# Patient Record
Sex: Female | Born: 1975 | Race: Black or African American | Hispanic: No | Marital: Single | Smoking: Never smoker
Health system: Southern US, Community
[De-identification: ages and names within clinical notes are randomized; demographics above are authoritative.]

## PROBLEM LIST (undated history)

## (undated) DIAGNOSIS — F329 Major depressive disorder, single episode, unspecified: Secondary | ICD-10-CM

## (undated) DIAGNOSIS — G4733 Obstructive sleep apnea (adult) (pediatric): Secondary | ICD-10-CM

## (undated) DIAGNOSIS — E785 Hyperlipidemia, unspecified: Secondary | ICD-10-CM

## (undated) DIAGNOSIS — I639 Cerebral infarction, unspecified: Secondary | ICD-10-CM

## (undated) DIAGNOSIS — F32A Depression, unspecified: Secondary | ICD-10-CM

## (undated) DIAGNOSIS — G43909 Migraine, unspecified, not intractable, without status migrainosus: Secondary | ICD-10-CM

## (undated) DIAGNOSIS — E559 Vitamin D deficiency, unspecified: Secondary | ICD-10-CM

## (undated) DIAGNOSIS — I10 Essential (primary) hypertension: Secondary | ICD-10-CM

## (undated) DIAGNOSIS — F419 Anxiety disorder, unspecified: Secondary | ICD-10-CM

## (undated) HISTORY — DX: Vitamin D deficiency, unspecified: E55.9

## (undated) HISTORY — DX: Obstructive sleep apnea (adult) (pediatric): G47.33

## (undated) HISTORY — DX: Hyperlipidemia, unspecified: E78.5

## (undated) HISTORY — DX: Essential (primary) hypertension: I10

## (undated) HISTORY — DX: Depression, unspecified: F32.A

## (undated) HISTORY — DX: Cerebral infarction, unspecified: I63.9

## (undated) HISTORY — DX: Morbid (severe) obesity due to excess calories: E66.01

## (undated) HISTORY — DX: Major depressive disorder, single episode, unspecified: F32.9

## (undated) HISTORY — DX: Anxiety disorder, unspecified: F41.9

## (undated) HISTORY — DX: Migraine, unspecified, not intractable, without status migrainosus: G43.909

## (undated) HISTORY — PX: LAPAROSCOPIC GASTRIC SLEEVE RESECTION: SHX5895

---

## 2003-06-06 LAB — HM MAMMOGRAPHY: HM Mammogram: 0

## 2005-01-12 ENCOUNTER — Ambulatory Visit: Payer: Self-pay | Admitting: Internal Medicine

## 2008-06-05 LAB — HM DIABETES FOOT EXAM: HM Diabetic Foot Exam: 0

## 2008-09-23 ENCOUNTER — Other Ambulatory Visit: Payer: Self-pay | Admitting: Emergency Medicine

## 2008-09-24 ENCOUNTER — Emergency Department (HOSPITAL_COMMUNITY): Admission: EM | Admit: 2008-09-24 | Discharge: 2008-09-24 | Payer: Self-pay | Admitting: Emergency Medicine

## 2010-02-06 ENCOUNTER — Emergency Department (HOSPITAL_BASED_OUTPATIENT_CLINIC_OR_DEPARTMENT_OTHER): Admission: EM | Admit: 2010-02-06 | Discharge: 2010-02-06 | Payer: Self-pay | Admitting: Emergency Medicine

## 2010-05-10 ENCOUNTER — Emergency Department (HOSPITAL_BASED_OUTPATIENT_CLINIC_OR_DEPARTMENT_OTHER)
Admission: EM | Admit: 2010-05-10 | Discharge: 2010-05-10 | Payer: Self-pay | Source: Home / Self Care | Admitting: Emergency Medicine

## 2010-08-18 LAB — URINE MICROSCOPIC-ADD ON

## 2010-08-18 LAB — URINALYSIS, ROUTINE W REFLEX MICROSCOPIC
Glucose, UA: NEGATIVE mg/dL
Leukocytes, UA: NEGATIVE
pH: 5 (ref 5.0–8.0)

## 2010-08-18 LAB — PREGNANCY, URINE: Preg Test, Ur: NEGATIVE

## 2010-09-14 LAB — CBC
MCHC: 31.6 g/dL (ref 30.0–36.0)
MCV: 81.5 fL (ref 78.0–100.0)
WBC: 10 10*3/uL (ref 4.0–10.5)

## 2010-09-14 LAB — DIFFERENTIAL
Lymphocytes Relative: 33 % (ref 12–46)
Monocytes Relative: 8 % (ref 3–12)
Neutro Abs: 5.8 10*3/uL (ref 1.7–7.7)

## 2010-09-14 LAB — BASIC METABOLIC PANEL
CO2: 31 mEq/L (ref 19–32)
Creatinine, Ser: 0.7 mg/dL (ref 0.4–1.2)
GFR calc Af Amer: >60 mL/min (ref >60)
GFR calc non Af Amer: >60 mL/min (ref >60)
Potassium: 4.3 mEq/L (ref 3.5–5.1)
Sodium: 139 mEq/L (ref 135–145)

## 2012-08-01 ENCOUNTER — Encounter: Payer: Self-pay | Admitting: Family Medicine

## 2012-08-01 DIAGNOSIS — F329 Major depressive disorder, single episode, unspecified: Secondary | ICD-10-CM | POA: Insufficient documentation

## 2012-08-01 DIAGNOSIS — F419 Anxiety disorder, unspecified: Secondary | ICD-10-CM | POA: Insufficient documentation

## 2012-08-01 DIAGNOSIS — I10 Essential (primary) hypertension: Secondary | ICD-10-CM | POA: Insufficient documentation

## 2012-08-01 DIAGNOSIS — E559 Vitamin D deficiency, unspecified: Secondary | ICD-10-CM | POA: Insufficient documentation

## 2012-08-01 DIAGNOSIS — G43909 Migraine, unspecified, not intractable, without status migrainosus: Secondary | ICD-10-CM | POA: Insufficient documentation

## 2012-08-01 DIAGNOSIS — I639 Cerebral infarction, unspecified: Secondary | ICD-10-CM | POA: Insufficient documentation

## 2012-08-01 DIAGNOSIS — E119 Type 2 diabetes mellitus without complications: Secondary | ICD-10-CM | POA: Insufficient documentation

## 2012-08-01 DIAGNOSIS — E785 Hyperlipidemia, unspecified: Secondary | ICD-10-CM | POA: Insufficient documentation

## 2012-08-29 ENCOUNTER — Other Ambulatory Visit: Payer: Self-pay | Admitting: *Deleted

## 2012-08-29 DIAGNOSIS — E785 Hyperlipidemia, unspecified: Secondary | ICD-10-CM

## 2012-08-29 DIAGNOSIS — E559 Vitamin D deficiency, unspecified: Secondary | ICD-10-CM

## 2012-08-29 DIAGNOSIS — I10 Essential (primary) hypertension: Secondary | ICD-10-CM

## 2012-08-29 DIAGNOSIS — IMO0001 Reserved for inherently not codable concepts without codable children: Secondary | ICD-10-CM

## 2012-08-30 ENCOUNTER — Other Ambulatory Visit: Payer: Self-pay

## 2012-09-02 ENCOUNTER — Other Ambulatory Visit: Payer: Self-pay

## 2012-09-03 ENCOUNTER — Telehealth: Payer: Self-pay | Admitting: *Deleted

## 2012-09-03 NOTE — Telephone Encounter (Signed)
PLEASE CALL PT BACK IN REGUARDS TO LABS. [phone]

## 2012-09-05 ENCOUNTER — Encounter: Payer: Self-pay | Admitting: Family Medicine

## 2012-09-05 ENCOUNTER — Ambulatory Visit (INDEPENDENT_AMBULATORY_CARE_PROVIDER_SITE_OTHER): Payer: BC Managed Care – PPO | Admitting: Family Medicine

## 2012-09-05 VITALS — BP 124/84 | HR 88 | Wt 379.0 lb

## 2012-09-05 DIAGNOSIS — D649 Anemia, unspecified: Secondary | ICD-10-CM

## 2012-09-05 DIAGNOSIS — IMO0001 Reserved for inherently not codable concepts without codable children: Secondary | ICD-10-CM

## 2012-09-05 DIAGNOSIS — R5383 Other fatigue: Secondary | ICD-10-CM

## 2012-09-05 DIAGNOSIS — E785 Hyperlipidemia, unspecified: Secondary | ICD-10-CM

## 2012-09-05 DIAGNOSIS — R5381 Other malaise: Secondary | ICD-10-CM

## 2012-09-05 MED ORDER — ATORVASTATIN CALCIUM 10 MG PO TABS: 10.0000 mg | ORAL_TABLET | Freq: Every day | ORAL | Status: AC

## 2012-09-05 MED ORDER — GLIPIZIDE ER 5 MG PO TB24: 5.0000 mg | ORAL_TABLET | Freq: Every day | ORAL | Status: DC

## 2012-09-05 MED ORDER — IRBESARTAN-HYDROCHLOROTHIAZIDE 300-12.5 MG PO TABS: 1.0000 | ORAL_TABLET | Freq: Every day | ORAL | Status: DC

## 2012-09-05 MED ORDER — METFORMIN HCL 1000 MG PO TABS: 1000.0000 mg | ORAL_TABLET | Freq: Two times a day (BID) | ORAL | Status: DC

## 2012-09-05 NOTE — Telephone Encounter (Signed)
Pt has difficulty being transported to the lab.  She is scheduled today for an OV. PG

## 2012-09-05 NOTE — Progress Notes (Signed)
Subjective:     Patient ID: Megan Hale, female   DOB: 14-Jul-1975, 37 y.o.   MRN: [id number]  HPI Megan Hale is here today with her mom to follow up on her multiple medical problems. She is currently not on any of her medications.  She has recently gone on Obamacare and brought in the medication listing for her new company. She continues to work on her diet and has lost another pound.  She has regained some strength in her right leg and a minimal amount in her right hand.    Review of Systems  Musculoskeletal:       Increased strength and movement in her right leg.         Objective:   Physical Exam  Constitutional:  Body Habitus - Morbidly obese   Musculoskeletal:  She was able to walk without assistance today which is the first time since having her stroke.         Assessment:     Type II DM Hyperlipidemia CVA Hypertension    Plan:     We'll start her on a combination of metformin and glipizide.   She was given a prescription for irbesartan/HCT. She was previously on one Lipitor 80 mg per week. This was done to save her money.  We're going to let her try 10 mg daily.   She is doing much better with the strength in her leg.  She is getting a little movement in her hand. She will continue to work on her exercises.

## 2013-01-22 ENCOUNTER — Ambulatory Visit: Payer: BC Managed Care – PPO | Admitting: Family Medicine

## 2013-02-04 ENCOUNTER — Ambulatory Visit: Payer: BC Managed Care – PPO | Admitting: Family Medicine

## 2013-02-25 ENCOUNTER — Ambulatory Visit (INDEPENDENT_AMBULATORY_CARE_PROVIDER_SITE_OTHER): Payer: BC Managed Care – PPO | Admitting: Family Medicine

## 2013-02-25 VITALS — BP 149/90 | HR 87 | Resp 16 | Ht 67.0 in | Wt 392.0 lb

## 2013-02-25 DIAGNOSIS — E119 Type 2 diabetes mellitus without complications: Secondary | ICD-10-CM

## 2013-02-25 DIAGNOSIS — I10 Essential (primary) hypertension: Secondary | ICD-10-CM

## 2013-02-25 DIAGNOSIS — I639 Cerebral infarction, unspecified: Secondary | ICD-10-CM

## 2013-02-25 DIAGNOSIS — Z7721 Contact with and (suspected) exposure to potentially hazardous body fluids: Secondary | ICD-10-CM

## 2013-02-25 DIAGNOSIS — I6350 Cerebral infarction due to unspecified occlusion or stenosis of unspecified cerebral artery: Secondary | ICD-10-CM

## 2013-02-25 DIAGNOSIS — E785 Hyperlipidemia, unspecified: Secondary | ICD-10-CM

## 2013-02-25 LAB — POCT GLYCOSYLATED HEMOGLOBIN (HGB A1C): Hemoglobin A1C: 5.7

## 2013-02-25 MED ORDER — ATENOLOL 50 MG PO TABS: 50.0000 mg | ORAL_TABLET | Freq: Every day | ORAL | Status: DC

## 2013-02-25 NOTE — Progress Notes (Signed)
  Subjective:    Patient ID: Megan Hale, female    DOB: Oct 25, 1975, 37 y.o.   MRN: [id number]  HPI  [name] is here today to discuss the conditions listed below:    1)  Type II DM:  She has been taking metformin and glipizide XR since her last visit.  We had to switch her from Janumet because of the cost.  She feels that she is doing okay with this combination.    2)  Weight:  She had done well with her weight loss but unfortunately has put 12 lbs back on.  She says that she has been cooking some which has contributed to her weight gain.  She is planning to start a diet and is seriously considering gastric bypass surgery.      3)  STDs:  She wants to be screened for STDs because she recently had unprotected intercourse.  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  She does not currently have any vaginal symptoms.    4)  Hypertension:  She is taking her Avalide 300/12.5 daily.  She is also supposed to be on atenolol but she is not and does not know why.  This could explain why her pressure is high.      5)  CVA:  She continues to slowly improve.  She is trying to get her previous job to let her work from home.     Review of Systems  Constitutional: Negative.   HENT: Negative.   Eyes: Negative.   Respiratory: Negative.   Cardiovascular: Negative.   Gastrointestinal: Negative.   Endocrine: Negative.   Genitourinary: Negative.   Musculoskeletal: Negative.   Skin: Negative.   Allergic/Immunologic: Negative.   Neurological: Negative.   Hematological: Negative.   Psychiatric/Behavioral: Negative.     Past Medical History  Diagnosis Date  . CVA (cerebral vascular accident)     Acute left Basal Ganglia - right hemiplagia - Dr Johnell Comings  . Morbid obesity   . Hypertension   . Hyperlipidemia   . DM type 2 (diabetes mellitus, type 2)   . OSA (obstructive sleep apnea)     Bi-Pap  . Migraine   . Depression   .  Anxiety   . Vitamin D deficiency      Family History  Problem Relation Age of Onset  . CVA Father 27  . Hypertension Father   . Heart attack Brother 40  . CVA Brother           Objective:   Physical Exam  Vitals reviewed. Constitutional: She is oriented to person, place, and time. She appears well-developed and well-nourished.  Cardiovascular: Normal rate and regular rhythm.   Pulmonary/Chest: Effort normal and breath sounds normal.  Neurological: She is alert and oriented to person, place, and time.  Skin: Skin is warm and dry.  Psychiatric: She has a normal mood and affect.      Assessment & Plan:

## 2013-02-25 NOTE — Patient Instructions (Addendum)
1)  BP - Take the irbesartan/HCT 300/12.5 plus the atenolol 50 mg daily.  Get the atenolol at [facility] and you should only pay 9.99 for 90 day supply.    2)  Blood Sugar - Your A1c is great at 5.7 % so stay on your Glipizide XL plus the metformin.  Get it at [facility] for free.   3)  Cholesterol - Take the atorvastatin 10 mg daily not weekly.    4)  Stroke prevention - Take 325 of ASA daily.    5)  Labs - Drink a lot of water this evening and in the morning.  Collect 1st morning urine and come in for lab draw early afternoon and Katrina will get your blood.  (Room 301).

## 2013-02-26 ENCOUNTER — Encounter: Payer: Self-pay | Admitting: Family Medicine

## 2013-02-26 DIAGNOSIS — Z7721 Contact with and (suspected) exposure to potentially hazardous body fluids: Secondary | ICD-10-CM | POA: Insufficient documentation

## 2013-02-26 DIAGNOSIS — I10 Essential (primary) hypertension: Secondary | ICD-10-CM | POA: Insufficient documentation

## 2013-02-26 DIAGNOSIS — E785 Hyperlipidemia, unspecified: Secondary | ICD-10-CM | POA: Insufficient documentation

## 2013-02-26 DIAGNOSIS — E119 Type 2 diabetes mellitus without complications: Secondary | ICD-10-CM | POA: Insufficient documentation

## 2013-02-26 NOTE — Assessment & Plan Note (Signed)
She does not know why she is not on the atenolol.  She was previously on Bystolic and we switched her due to the cost once she went on Obamacare.

## 2013-02-26 NOTE — Assessment & Plan Note (Signed)
Checking for STDs 

## 2013-02-26 NOTE — Assessment & Plan Note (Signed)
Megan Hale was a little confused about her atorvastatin. We used to have her on 80 mg that she took once a week.  After her CVA, I thought it would be a better idea to take it daily so she was supposed to take 10 mg daily. She is going to start taking it this way.

## 2013-02-26 NOTE — Assessment & Plan Note (Signed)
She has gained 12 lbs from her last visit but she is still 34 lbs down from her highest weight of 426 back in 2011. We have discussed her getting gastric bypass surgery several times.  She says that she has decided that she needs this surgery and is just trying to bring herself to the point that she decides to proceed.

## 2013-02-26 NOTE — Assessment & Plan Note (Addendum)
Her A1c is great at 5.7%.

## 2013-02-26 NOTE — Assessment & Plan Note (Signed)
She continues to improve.

## 2013-02-27 ENCOUNTER — Encounter: Payer: Self-pay | Admitting: Family Medicine

## 2013-02-27 ENCOUNTER — Other Ambulatory Visit: Payer: Self-pay | Admitting: Family Medicine

## 2013-02-27 DIAGNOSIS — B009 Herpesviral infection, unspecified: Secondary | ICD-10-CM

## 2013-02-27 LAB — HEPATITIS C ANTIBODY: HCV Ab: NEGATIVE

## 2013-02-27 LAB — RPR

## 2013-02-27 LAB — GC/CHLAMYDIA PROBE AMP, URINE
Chlamydia, Swab/Urine, PCR: NEGATIVE
GC Probe Amp, Urine: NEGATIVE

## 2013-02-27 LAB — HIV ANTIBODY (ROUTINE TESTING W REFLEX): HIV: NONREACTIVE

## 2013-02-27 LAB — HEPATITIS B SURFACE ANTIGEN: Hepatitis B Surface Ag: NEGATIVE

## 2013-02-27 LAB — HSV 2 ANTIBODY, IGG: HSV 2 Glycoprotein G Ab, IgG: 8.36 IV — ABNORMAL HIGH

## 2013-02-27 MED ORDER — VALACYCLOVIR HCL 1 G PO TABS: ORAL_TABLET | ORAL | Status: DC

## 2013-03-05 ENCOUNTER — Encounter: Payer: Self-pay | Admitting: Family Medicine

## 2013-03-20 ENCOUNTER — Encounter: Payer: BC Managed Care – PPO | Admitting: Family Medicine

## 2013-04-10 ENCOUNTER — Ambulatory Visit (INDEPENDENT_AMBULATORY_CARE_PROVIDER_SITE_OTHER): Payer: BC Managed Care – PPO | Admitting: Family Medicine

## 2013-04-10 ENCOUNTER — Encounter: Payer: Self-pay | Admitting: Family Medicine

## 2013-04-10 ENCOUNTER — Other Ambulatory Visit: Payer: Self-pay | Admitting: *Deleted

## 2013-04-10 VITALS — BP 143/86 | HR 78 | Resp 18 | Ht 67.0 in | Wt 388.0 lb

## 2013-04-10 DIAGNOSIS — R109 Unspecified abdominal pain: Secondary | ICD-10-CM

## 2013-04-10 DIAGNOSIS — I639 Cerebral infarction, unspecified: Secondary | ICD-10-CM

## 2013-04-10 DIAGNOSIS — I6350 Cerebral infarction due to unspecified occlusion or stenosis of unspecified cerebral artery: Secondary | ICD-10-CM

## 2013-04-10 DIAGNOSIS — G5600 Carpal tunnel syndrome, unspecified upper limb: Secondary | ICD-10-CM

## 2013-04-10 NOTE — Progress Notes (Signed)
Subjective:    Patient ID: Megan Hale, female    DOB: [birth year], 37 y.o.   MRN: [id number]  HPI  Pamela is here today for a couple of issues.  She has been having some numbness and tingling in her left hand and fingers for the past month.  She notices this early in the morning.  She moves her fingers around and they seem to wake up.  She has also been having some abdominal cramping for the past 3 weeks. She is on her period right now. She also needs to get more forms completed for her employer for her disability.    Review of Systems  Constitutional: Negative.   HENT: Negative.   Eyes: Negative.   Respiratory: Negative.   Cardiovascular: Negative.   Gastrointestinal: Positive for abdominal pain.  Endocrine: Negative.   Genitourinary: Negative for dysuria and frequency.  Musculoskeletal:       Numbness in her left hand and fingers  Allergic/Immunologic: Negative.   Neurological: Negative.   Hematological: Negative.   Psychiatric/Behavioral: Negative.      Past Medical History  Diagnosis Date  . CVA (cerebral vascular accident)     Acute left Basal Ganglia - right hemiplagia - Dr Johnell Comings  . Morbid obesity   . Hypertension   . Hyperlipidemia   . DM type 2 (diabetes mellitus, type 2)   . OSA (obstructive sleep apnea)     Bi-Pap  . Migraine   . Depression   . Anxiety   . Vitamin D deficiency      History   Social History Narrative   Marital Status:  Single   Children:  None (She would like to adopt a little girl [blonde hair/blue eyes]).   Pets:   None    Living Situation: Her mother has been living with her for the past year since she had her CVA.  Her mom is getting ready to move back to her own home.     Occupation: She is currently out on LTD with ([employer]). 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     Education: Engineer, maintenance (IT) (BA - History) [employer]    Tobacco Use/Exposure:  None    Alcohol  Use:  Occasional   Drug Use:  None   Diet:  Regular   Exercise:  Limited    Hobbies:  Poetry, Museums                  Family History  Problem Relation Age of Onset  . CVA Father 38  . Hypertension Father   . Heart attack Brother 40  . CVA Brother      Current Outpatient Prescriptions on File Prior to Visit  Medication Sig Dispense Refill  . aspirin 325 MG tablet Take 325 mg by mouth daily.      Marland Kitchen atenolol (TENORMIN) 50 MG tablet Take 1 tablet (50 mg total) by mouth daily.  90 tablet  1  . atorvastatin (LIPITOR) 10 MG tablet Take 1 tablet (10 mg total) by mouth daily.  1 tablet  11  . glipiZIDE (GLUCOTROL XL) 5 MG 24 hr tablet Take 1 tablet (5 mg total) by mouth daily.  30 tablet  11  . irbesartan-hydrochlorothiazide (AVALIDE) 300-12.5 MG per tablet Take 1 tablet by mouth daily.  30 tablet  11  . metFORMIN (GLUCOPHAGE) 1000 MG tablet Take 1 tablet (1,000 mg total) by mouth 2 (two) times daily  with a meal.  60 tablet  11   No current facility-administered medications on file prior to visit.     Allergies  Allergen Reactions  . Augmentin [Amoxicillin-Pot Clavulanate]        Objective:   Physical Exam  Nursing note and vitals reviewed. Constitutional: She is oriented to person, place, and time.  Well groomed, morbidly obese  Abdominal: There is tenderness (Mild).  Her abdomen is very large.  It is really hard to assess where her pain is.    Musculoskeletal: She exhibits tenderness (in wrists). She exhibits no edema.  Neurological: She is alert and oriented to person, place, and time.  Psychiatric: She has a normal mood and affect. Her behavior is normal. Judgment and thought content normal.      Assessment & Plan:    Faylynn was seen today for numbness in fingers.  Diagnoses and associated orders for this visit:  Carpal tunnel syndrome Comments: Her finger numbness is consistent with Carpal Tunnel Syndrome.  She was encouraged to get a [facility] wrist splint.     Abdominal pain, unspecified site Comments: Her pain seems to be related to her period.  She will follow up if this does not resolve.    CVA (cerebral vascular accident) Comments: Several forms were completed for her insurance for her disability.    TIME SPENT "FACE TO FACE" WITH PATIENT -  30 MINS

## 2013-04-10 NOTE — Patient Instructions (Addendum)
1)  Carpal Tunnel - Wrist Splint ([facility])    Carpal Tunnel Syndrome The carpal tunnel is a narrow area located on the palm side of your wrist. The tunnel is formed by the wrist bones and ligaments. Nerves, blood vessels, and tendons pass through the carpal tunnel. Repeated wrist motion or certain diseases may cause swelling within the tunnel. This swelling pinches the main nerve in the wrist (median nerve) and causes the painful hand and arm condition called carpal tunnel syndrome. CAUSES   Repeated wrist motions.  Wrist injuries.  Certain diseases like arthritis, diabetes, alcoholism, hyperthyroidism, and kidney failure.  Obesity.  Pregnancy. SYMPTOMS   A "pins and needles" feeling in your fingers or hand.  Tingling or numbness in your fingers or hand.  An aching feeling in your entire arm.  Wrist pain that goes up your arm to your shoulder.  Pain that goes down into your palm or fingers.  A weak feeling in your hands. DIAGNOSIS  Your caregiver will take your history and perform a physical exam. An electromyography test may be needed. This test measures electrical signals sent out by the muscles. The electrical signals are usually slowed by carpal tunnel syndrome. You may also need X-rays. TREATMENT  Carpal tunnel syndrome may clear up by itself. Your caregiver may recommend a wrist splint or medicine such as a nonsteroidal anti-inflammatory medicine. Cortisone injections may help. Sometimes, surgery may be needed to free the pinched nerve.  HOME CARE INSTRUCTIONS   Take all medicine as directed by your caregiver. Only take over-the-counter or prescription medicines for pain, discomfort, or fever as directed by your caregiver.  If you were given a splint to keep your wrist from bending, wear it as directed. It is important to wear the splint at night. Wear the splint for as long as you have pain or numbness in your hand, arm, or wrist. This may take 1 to 2 months.  Rest your  wrist from any activity that may be causing your pain. If your symptoms are work-related, you may need to talk to your employer about changing to a job that does not require using your wrist.  Put ice on your wrist after long periods of wrist activity.  Put ice in a plastic bag.  Place a towel between your skin and the bag.  Leave the ice on for 15-20 minutes, 03-04 times a day.  Keep all follow-up visits as directed by your caregiver. This includes any orthopedic referrals, physical therapy, and rehabilitation. Any delay in getting necessary care could result in a delay or failure of your condition to heal. SEEK IMMEDIATE MEDICAL CARE IF:   You have new, unexplained symptoms.  Your symptoms get worse and are not helped or controlled with medicines. MAKE SURE YOU:   Understand these instructions.  Will watch your condition.  Will get help right away if you are not doing well or get worse. Document Released: 05/19/2000 Document Revised: 08/14/2011 Document Reviewed: 04/07/2011 [facility] Patient Information 2014 [place], [place].

## 2013-06-10 ENCOUNTER — Ambulatory Visit: Payer: BC Managed Care – PPO | Admitting: Family Medicine

## 2013-06-22 DIAGNOSIS — G5600 Carpal tunnel syndrome, unspecified upper limb: Secondary | ICD-10-CM | POA: Insufficient documentation

## 2013-06-22 DIAGNOSIS — R109 Unspecified abdominal pain: Secondary | ICD-10-CM | POA: Insufficient documentation

## 2013-07-18 ENCOUNTER — Encounter: Payer: Self-pay | Admitting: Podiatry

## 2013-07-18 ENCOUNTER — Ambulatory Visit (INDEPENDENT_AMBULATORY_CARE_PROVIDER_SITE_OTHER): Payer: BC Managed Care – PPO | Admitting: Podiatry

## 2013-07-18 VITALS — BP 117/68 | HR 71 | Ht 67.0 in | Wt 388.0 lb

## 2013-07-18 DIAGNOSIS — M25571 Pain in right ankle and joints of right foot: Secondary | ICD-10-CM | POA: Insufficient documentation

## 2013-07-18 DIAGNOSIS — M216X9 Other acquired deformities of unspecified foot: Secondary | ICD-10-CM

## 2013-07-18 DIAGNOSIS — M25579 Pain in unspecified ankle and joints of unspecified foot: Secondary | ICD-10-CM

## 2013-07-18 NOTE — Patient Instructions (Addendum)
Seen for right ankle pain. Has tight achilles tendon and varus rotation of the foot. Need physical therapy to stretch tendon and brace.

## 2013-07-18 NOTE — Progress Notes (Signed)
Subjective: 38 year old female presents with pain in right ankle for duration of 4 months. Hurts all the time now.  Had stroke in 2013 and got right side weak, so uses wheel chair off and on as needed.   Objective: Dermatologic: Thick dystrophic nails x 10. Ingrown deformed nail left hallux.  Vascular: Pedal pulses are all palpable. Neurologic:All epicritic and tactile sensations grossly intact. Positive response to all area of testing to Monofilament sensory testing bilateral. Decreased response to DTR ankles bilateral. Orthopedic:   Flat foot left with valgus rotation of forefoot. Tight Achilles tendon right (-20 degree) with plantar grading and varus positioning right foot. Pain at posterior lateral ankle. Radiographic examination reveal flattening arch with spurring at Navicular bone articulation surface with Talus bilateral.  Assessment: Ankle equinus right.  Right ankle pain secondary to pathologic varus position.   Plan: May benefit from Physical therapy, Ritch brace.  [facility]

## 2013-07-29 ENCOUNTER — Encounter: Payer: Self-pay | Admitting: Podiatry

## 2013-07-29 ENCOUNTER — Ambulatory Visit: Payer: BC Managed Care – PPO | Admitting: Podiatry

## 2013-07-29 ENCOUNTER — Ambulatory Visit (INDEPENDENT_AMBULATORY_CARE_PROVIDER_SITE_OTHER): Payer: BC Managed Care – PPO | Admitting: Podiatry

## 2013-07-29 DIAGNOSIS — M216X9 Other acquired deformities of unspecified foot: Secondary | ICD-10-CM

## 2013-07-29 DIAGNOSIS — M25579 Pain in unspecified ankle and joints of unspecified foot: Secondary | ICD-10-CM

## 2013-07-29 DIAGNOSIS — IMO0002 Reserved for concepts with insufficient information to code with codable children: Secondary | ICD-10-CM | POA: Insufficient documentation

## 2013-07-29 DIAGNOSIS — M25571 Pain in right ankle and joints of right foot: Secondary | ICD-10-CM

## 2013-07-29 DIAGNOSIS — I6789 Other cerebrovascular disease: Secondary | ICD-10-CM

## 2013-07-29 NOTE — Progress Notes (Signed)
Patient came in to prepare for Megan Hale. Has pain in right lateral ankle with flattened arch and weak right lower limb following a stroke in 2014.  Reviewed benefit of Megan Hale during previous visit. Right lower limb impression taken with Slipper cast fiber glass.

## 2013-07-29 NOTE — Patient Instructions (Signed)
Impression taken for Ritchie brace.

## 2013-07-31 ENCOUNTER — Other Ambulatory Visit: Payer: Self-pay | Admitting: Family Medicine

## 2013-08-28 ENCOUNTER — Other Ambulatory Visit: Payer: Self-pay | Admitting: Family Medicine

## 2013-08-28 DIAGNOSIS — E1059 Type 1 diabetes mellitus with other circulatory complications: Secondary | ICD-10-CM

## 2013-08-28 DIAGNOSIS — E119 Type 2 diabetes mellitus without complications: Secondary | ICD-10-CM

## 2013-08-28 DIAGNOSIS — I10 Essential (primary) hypertension: Secondary | ICD-10-CM

## 2013-08-29 NOTE — Telephone Encounter (Signed)
Refills requests sent.  Pt has an appointment on 09/04/13. PG

## 2013-09-04 ENCOUNTER — Encounter: Payer: Self-pay | Admitting: Family Medicine

## 2013-09-04 ENCOUNTER — Ambulatory Visit (INDEPENDENT_AMBULATORY_CARE_PROVIDER_SITE_OTHER): Payer: BC Managed Care – PPO | Admitting: Family Medicine

## 2013-09-04 ENCOUNTER — Encounter (INDEPENDENT_AMBULATORY_CARE_PROVIDER_SITE_OTHER): Payer: Self-pay

## 2013-09-04 VITALS — BP 136/97 | HR 69 | Resp 16 | Wt 395.0 lb

## 2013-09-04 DIAGNOSIS — F329 Major depressive disorder, single episode, unspecified: Secondary | ICD-10-CM

## 2013-09-04 DIAGNOSIS — E119 Type 2 diabetes mellitus without complications: Secondary | ICD-10-CM

## 2013-09-04 DIAGNOSIS — R51 Headache: Secondary | ICD-10-CM

## 2013-09-04 DIAGNOSIS — I10 Essential (primary) hypertension: Secondary | ICD-10-CM

## 2013-09-04 DIAGNOSIS — F32A Depression, unspecified: Secondary | ICD-10-CM

## 2013-09-04 DIAGNOSIS — F3289 Other specified depressive episodes: Secondary | ICD-10-CM

## 2013-09-04 LAB — POCT GLYCOSYLATED HEMOGLOBIN (HGB A1C): Hemoglobin A1C: 5.7

## 2013-09-04 MED ORDER — GLIPIZIDE ER 5 MG PO TB24: 5.0000 mg | ORAL_TABLET | Freq: Every day | ORAL | Status: DC

## 2013-09-04 MED ORDER — BUPROPION HCL ER (XL) 150 MG PO TB24: 150.0000 mg | ORAL_TABLET | Freq: Every day | ORAL | Status: DC

## 2013-09-04 MED ORDER — ATENOLOL 50 MG PO TABS: 50.0000 mg | ORAL_TABLET | Freq: Every day | ORAL | Status: AC

## 2013-09-04 MED ORDER — METFORMIN HCL 1000 MG PO TABS: 1000.0000 mg | ORAL_TABLET | Freq: Two times a day (BID) | ORAL | Status: DC

## 2013-09-04 MED ORDER — FLUOXETINE HCL 20 MG PO TABS: 20.0000 mg | ORAL_TABLET | Freq: Every day | ORAL | Status: DC

## 2013-09-04 MED ORDER — ATENOLOL 50 MG PO TABS: 50.0000 mg | ORAL_TABLET | Freq: Every day | ORAL | Status: DC

## 2013-09-04 MED ORDER — IRBESARTAN-HYDROCHLOROTHIAZIDE 300-12.5 MG PO TABS: 1.0000 | ORAL_TABLET | Freq: Every day | ORAL | Status: DC

## 2013-09-04 NOTE — Progress Notes (Signed)
Subjective:    Patient ID: Megan Hale, female    DOB: 1976-03-08, 38 y.o.   MRN: [id number]  HPI  Megan Hale is here today to discuss the conditions listed below:   1)  Headaches - She has been having chronic headaches for the past 3 months.  She describes them as being moderate in nature and are located in the frontal part of her head.  She says that they occur almost daily.  She recently had a severe headache and [facility]  and after several tests, she was told that she had a migraine headache and was given Fioricet.  She says that the Fioricet did not help her and she stopped taking it.  She then went to an Urgent Care and was given a round of antibiotics because they thought it may be a sinus infection.  She was given also Diclofenac which has not helped her.  She is scheduled to have a doppler and will follow up with her neurologist (Dr. Johnell ComingsMieden) next week.  Today she has a moderate headache.    2)  Type II DM - She continues taking a combination of Glipizide and Metformin.  She has not had a meter in several months.  She needs a prescription for her medicine and diabetic supplies.    3)  Hypertension - Her BP is a little high today on current medications.    4)  Weight - She has gained 7 lbs since her last visit.   5)  Depresson - She is feeling more depressed.     Review of Systems  Constitutional: Negative for activity change, fatigue and unexpected weight change.  Eyes: Negative.   Respiratory: Negative for shortness of breath.   Cardiovascular: Negative for chest pain, palpitations and leg swelling.  Gastrointestinal: Negative for diarrhea and constipation.  Endocrine: Negative.   Genitourinary: Negative for difficulty urinating.  Musculoskeletal: Negative.   Skin: Negative.   Neurological: Positive for headaches.  Hematological: Negative for adenopathy. Does not bruise/bleed easily.  Psychiatric/Behavioral: Negative for sleep disturbance and dysphoric mood. The  patient is not nervous/anxious.      Past Medical History  Diagnosis Date  . CVA (cerebral vascular accident)     Acute left Basal Ganglia - right hemiplagia - Dr Johnell ComingsMieden  . Morbid obesity   . Hypertension   . Hyperlipidemia   . DM type 2 (diabetes mellitus, type 2)   . OSA (obstructive sleep apnea)     Bi-Pap  . Migraine   . Depression   . Anxiety   . Vitamin D deficiency      History   Social History Narrative   Marital Status:  Single   Children:  None (She would like to adopt a little girl [blonde hair/blue eyes]).   Pets:   None    Living Situation: Her mother has been living with her for the past year since she had her CVA.  Her mom is getting ready to move back to her own home.     Occupation: She is currently out on LTD with ([employer]). 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     Education: Engineer, maintenance (IT)College Graduate (BA - History) [employer]    Tobacco Use/Exposure:  None    Alcohol Use:  Occasional   Drug Use:  None   Diet:  Regular   Exercise:  Limited    Hobbies:  Poetry, Museums  Family History  Problem Relation Age of Onset  . CVA Father 12  . Hypertension Father   . Stroke Mother   . Heart disease Mother   . CVA Brother   . Heart attack Brother      Current Outpatient Prescriptions on File Prior to Visit  Medication Sig Dispense Refill  . aspirin 325 MG tablet Take 325 mg by mouth daily.       No current facility-administered medications on file prior to visit.     Allergies  Allergen Reactions  . Augmentin [Amoxicillin-Pot Clavulanate]        Objective:   Physical Exam  Vitals reviewed. Constitutional: She is oriented to person, place, and time.  Eyes: Conjunctivae are normal. No scleral icterus.  Neck: Neck supple. No thyromegaly present.  Cardiovascular: Normal rate, regular rhythm and normal heart sounds.   Pulmonary/Chest: Effort normal and breath sounds  normal.  Musculoskeletal: She exhibits no edema and no tenderness.  Lymphadenopathy:    She has no cervical adenopathy.  Neurological: She is alert and oriented to person, place, and time.  Skin: Skin is warm and dry.  Psychiatric: She has a normal mood and affect. Her behavior is normal. Judgment and thought content normal.      Assessment & Plan:    Megan Hale was seen today for medication management.  Diagnoses and associated orders for this visit:  Type II or unspecified type diabetes mellitus without mention of complication, not stated as uncontrolled Comments: Her A1c is under good control at 5.7%.   - POCT glycosylated hemoglobin (Hb A1C) - glipiZIDE (GLIPIZIDE XL) 5 MG 24 hr tablet; Take 1 tablet (5 mg total) by mouth daily with breakfast. - metFORMIN (GLUCOPHAGE) 1000 MG tablet; Take 1 tablet (1,000 mg total) by mouth 2 (two) times daily with a meal.  Essential hypertension, benign - irbesartan-hydrochlorothiazide (AVALIDE) 300-12.5 MG per tablet; Take 1 tablet by mouth daily. - atenolol (TENORMIN) 50 MG tablet; Take 1 tablet (50 mg total) by mouth daily.  Depression Comments: She is going to start on Prozac then add Wellbutrin.   - FLUoxetine (PROZAC) 20 MG tablet; Take 1 tablet (20 mg total) by mouth daily. - buPROPion (WELLBUTRIN XL) 150 MG 24 hr tablet; Take 1 tablet (150 mg total) by mouth daily.  Headache(784.0) Comments: Megan Hale was recommended and she is to ask Dr. Johnell Comings about Topamax.    Morbid obesity Comments: She is to work harder on diet and exercise.     TIME SPENT "FACE TO FACE" WITH PATIENT -  45 MINS

## 2013-09-04 NOTE — Patient Instructions (Signed)
1)  Blood Sugar - Your A1c is great!!  Keep up the good work.  2)  BP - A little high so cut back on your sodium and try to exercise.    3)  Headaches - Migrelief; Ask Dr. Johnell ComingsMieden about Topamax   4)  Mood - Start on Prozac 20 mg daily for 2 weeks then start on Wellbutrin 150 mg in the am.     Headaches, Frequently Asked Questions MIGRAINE HEADACHES Q: What is migraine? What causes it? How can I treat it? A: Generally, migraine headaches begin as a dull ache. Then they develop into a constant, throbbing, and pulsating pain. You may experience pain at the temples. You may experience pain at the front or back of one or both sides of the head. The pain is usually accompanied by a combination of:  Nausea.  Vomiting.  Sensitivity to light and noise. Some people (about 15%) experience an aura (see below) before an attack. The cause of migraine is believed to be chemical reactions in the brain. Treatment for migraine may include over-the-counter or prescription medications. It may also include self-help techniques. These include relaxation training and biofeedback.  Q: What is an aura? A: About 15% of people with migraine get an "aura". This is a sign of neurological symptoms that occur before a migraine headache. You may see wavy or jagged lines, dots, or flashing lights. You might experience tunnel vision or blind spots in one or both eyes. The aura can include visual or auditory hallucinations (something imagined). It may include disruptions in smell (such as strange odors), taste or touch. Other symptoms include:  Numbness.  A "pins and needles" sensation.  Difficulty in recalling or speaking the correct word. These neurological events may last as long as 60 minutes. These symptoms will fade as the headache begins. Q: What is a trigger? A: Certain physical or environmental factors can lead to or "trigger" a migraine. These include:  Foods.  Hormonal changes.  Weather.  Stress. It is  important to remember that triggers are different for everyone. To help prevent migraine attacks, you need to figure out which triggers affect you. Keep a headache diary. This is a good way to track triggers. The diary will help you talk to your healthcare professional about your condition. Q: Does weather affect migraines? A: Bright sunshine, hot, humid conditions, and drastic changes in barometric pressure may lead to, or "trigger," a migraine attack in some people. But studies have shown that weather does not act as a trigger for everyone with migraines. Q: What is the link between migraine and hormones? A: Hormones start and regulate many of your body's functions. Hormones keep your body in balance within a constantly changing environment. The levels of hormones in your body are unbalanced at times. Examples are during menstruation, pregnancy, or menopause. That can lead to a migraine attack. In fact, about three quarters of all women with migraine report that their attacks are related to the menstrual cycle.  Q: Is there an increased risk of stroke for migraine sufferers? A: The likelihood of a migraine attack causing a stroke is very remote. That is not to say that migraine sufferers cannot have a stroke associated with their migraines. In persons under age 40, the most common associated factor for stroke is migraine headache. But over the course of a person's normal life span, the occurrence of migraine headache may actually be associated with a reduced risk of dying from cerebrovascular disease due to stroke.  Q: What are acute medications for migraine? A: Acute medications are used to treat the pain of the headache after it has started. Examples over-the-counter medications, NSAIDs, ergots, and triptans.  Q: What are the triptans? A: Triptans are the newest class of abortive medications. They are specifically targeted to treat migraine. Triptans are vasoconstrictors. They moderate some chemical  reactions in the brain. The triptans work on receptors in your brain. Triptans help to restore the balance of a neurotransmitter called serotonin. Fluctuations in levels of serotonin are thought to be a main cause of migraine.  Q: Are over-the-counter medications for migraine effective? A: Over-the-counter, or "OTC," medications may be effective in relieving mild to moderate pain and associated symptoms of migraine. But you should see your caregiver before beginning any treatment regimen for migraine.  Q: What are preventive medications for migraine? A: Preventive medications for migraine are sometimes referred to as "prophylactic" treatments. They are used to reduce the frequency, severity, and length of migraine attacks. Examples of preventive medications include antiepileptic medications, antidepressants, beta-blockers, calcium channel blockers, and NSAIDs (nonsteroidal anti-inflammatory drugs). Q: Why are anticonvulsants used to treat migraine? A: During the past few years, there has been an increased interest in antiepileptic drugs for the prevention of migraine. They are sometimes referred to as "anticonvulsants". Both epilepsy and migraine may be caused by similar reactions in the brain.  Q: Why are antidepressants used to treat migraine? A: Antidepressants are typically used to treat people with depression. They may reduce migraine frequency by regulating chemical levels, such as serotonin, in the brain.  Q: What alternative therapies are used to treat migraine? A: The term "alternative therapies" is often used to describe treatments considered outside the scope of conventional Western medicine. Examples of alternative therapy include acupuncture, acupressure, and yoga. Another common alternative treatment is herbal therapy. Some herbs are believed to relieve headache pain. Always discuss alternative therapies with your caregiver before proceeding. Some herbal products contain arsenic and other  toxins. TENSION HEADACHES Q: What is a tension-type headache? What causes it? How can I treat it? A: Tension-type headaches occur randomly. They are often the result of temporary stress, anxiety, fatigue, or anger. Symptoms include soreness in your temples, a tightening band-like sensation around your head (a "vice-like" ache). Symptoms can also include a pulling feeling, pressure sensations, and contracting head and neck muscles. The headache begins in your forehead, temples, or the back of your head and neck. Treatment for tension-type headache may include over-the-counter or prescription medications. Treatment may also include self-help techniques such as relaxation training and biofeedback. CLUSTER HEADACHES Q: What is a cluster headache? What causes it? How can I treat it? A: Cluster headache gets its name because the attacks come in groups. The pain arrives with little, if any, warning. It is usually on one side of the head. A tearing or bloodshot eye and a runny nose on the same side of the headache may also accompany the pain. Cluster headaches are believed to be caused by chemical reactions in the brain. They have been described as the most severe and intense of any headache type. Treatment for cluster headache includes prescription medication and oxygen. SINUS HEADACHES Q: What is a sinus headache? What causes it? How can I treat it? A: When a cavity in the bones of the face and skull (a sinus) becomes inflamed, the inflammation will cause localized pain. This condition is usually the result of an allergic reaction, a tumor, or an infection. If your headache is  caused by a sinus blockage, such as an infection, you will probably have a fever. An x-ray will confirm a sinus blockage. Your caregiver's treatment might include antibiotics for the infection, as well as antihistamines or decongestants.  REBOUND HEADACHES Q: What is a rebound headache? What causes it? How can I treat it? A: A pattern of  taking acute headache medications too often can lead to a condition known as "rebound headache." A pattern of taking too much headache medication includes taking it more than 2 days per week or in excessive amounts. That means more than the label or a caregiver advises. With rebound headaches, your medications not only stop relieving pain, they actually begin to cause headaches. Doctors treat rebound headache by tapering the medication that is being overused. Sometimes your caregiver will gradually substitute a different type of treatment or medication. Stopping may be a challenge. Regularly overusing a medication increases the potential for serious side effects. Consult a caregiver if you regularly use headache medications more than 2 days per week or more than the label advises. ADDITIONAL QUESTIONS AND ANSWERS Q: What is biofeedback? A: Biofeedback is a self-help treatment. Biofeedback uses special equipment to monitor your body's involuntary physical responses. Biofeedback monitors:  Breathing.  Pulse.  Heart rate.  Temperature.  Muscle tension.  Brain activity. Biofeedback helps you refine and perfect your relaxation exercises. You learn to control the physical responses that are related to stress. Once the technique has been mastered, you do not need the equipment any more. Q: Are headaches hereditary? A: Four out of five (80%) of people that suffer report a family history of migraine. Scientists are not sure if this is genetic or a family predisposition. Despite the uncertainty, a child has a 50% chance of having migraine if one parent suffers. The child has a 75% chance if both parents suffer.  Q: Can children get headaches? A: By the time they reach high school, most young people have experienced some type of headache. Many safe and effective approaches or medications can prevent a headache from occurring or stop it after it has begun.  Q: What type of doctor should I see to diagnose  and treat my headache? A: Start with your primary caregiver. Discuss his or her experience and approach to headaches. Discuss methods of classification, diagnosis, and treatment. Your caregiver may decide to recommend you to a headache specialist, depending upon your symptoms or other physical conditions. Having diabetes, allergies, etc., may require a more comprehensive and inclusive approach to your headache. The National Head[employer] Foundation will provide, upon request, a list of [facility] physician members in your state. Document Released: 08/12/2003 Document Revised: 08/14/2011 Document Reviewed: 01/20/2008 [facility] Patient Information 2014 [place], [place].

## 2013-10-10 ENCOUNTER — Telehealth: Payer: Self-pay

## 2013-10-10 NOTE — Telephone Encounter (Signed)
Ann from [employer] called to see if Dr Alberteen SamZanard was still keeping [name] out of work. They are needing something to let them know because the end of her original leave is coming up. You can call [phone] or fax something to [phone]

## 2013-11-02 DIAGNOSIS — R519 Headache, unspecified: Secondary | ICD-10-CM | POA: Insufficient documentation

## 2013-11-02 DIAGNOSIS — R51 Headache: Secondary | ICD-10-CM | POA: Insufficient documentation

## 2018-02-13 ENCOUNTER — Other Ambulatory Visit: Payer: Self-pay

## 2018-02-13 ENCOUNTER — Emergency Department (HOSPITAL_BASED_OUTPATIENT_CLINIC_OR_DEPARTMENT_OTHER): Payer: Medicare HMO

## 2018-02-13 ENCOUNTER — Emergency Department (HOSPITAL_BASED_OUTPATIENT_CLINIC_OR_DEPARTMENT_OTHER)
Admission: EM | Admit: 2018-02-13 | Discharge: 2018-02-13 | Disposition: A | Discharge status: Home / Self Care | Payer: Medicare HMO | Attending: Emergency Medicine | Admitting: Emergency Medicine

## 2018-02-13 ENCOUNTER — Encounter (HOSPITAL_BASED_OUTPATIENT_CLINIC_OR_DEPARTMENT_OTHER): Payer: Self-pay

## 2018-02-13 DIAGNOSIS — Z7982 Long term (current) use of aspirin: Secondary | ICD-10-CM | POA: Insufficient documentation

## 2018-02-13 DIAGNOSIS — M25532 Pain in left wrist: Secondary | ICD-10-CM | POA: Insufficient documentation

## 2018-02-13 DIAGNOSIS — F329 Major depressive disorder, single episode, unspecified: Secondary | ICD-10-CM | POA: Diagnosis not present

## 2018-02-13 DIAGNOSIS — F419 Anxiety disorder, unspecified: Secondary | ICD-10-CM | POA: Insufficient documentation

## 2018-02-13 DIAGNOSIS — M25531 Pain in right wrist: Secondary | ICD-10-CM

## 2018-02-13 DIAGNOSIS — Z8673 Personal history of transient ischemic attack (TIA), and cerebral infarction without residual deficits: Secondary | ICD-10-CM | POA: Insufficient documentation

## 2018-02-13 DIAGNOSIS — Z79899 Other long term (current) drug therapy: Secondary | ICD-10-CM | POA: Insufficient documentation

## 2018-02-13 DIAGNOSIS — E119 Type 2 diabetes mellitus without complications: Secondary | ICD-10-CM | POA: Diagnosis not present

## 2018-02-13 DIAGNOSIS — I10 Essential (primary) hypertension: Secondary | ICD-10-CM | POA: Insufficient documentation

## 2018-02-13 DIAGNOSIS — Z9884 Bariatric surgery status: Secondary | ICD-10-CM | POA: Diagnosis not present

## 2018-02-13 MED ORDER — DICLOFENAC SODIUM 1 % TD GEL: 2.0000 g | Freq: Four times a day (QID) | TRANSDERMAL | 0 refills | Status: AC

## 2018-02-13 NOTE — Discharge Instructions (Addendum)
Your wrist x-ray was normal today. No signs of any fractures or dislocations.  I have prescribed you Voltaren gel to put on your wrist. I recommend that you ice your wrist as well. You can wear the wrist brace when you are going to be using your hands a lot. It may be taken off for showering and at bedtime.  Please follow-up with your PCP, Dr. Shelle Iron, to discuss other options further.

## 2018-02-13 NOTE — ED Triage Notes (Signed)
Pt c/o pain to left wrist that started when she was placing weight on wrist to stand-pain worse today-NAD-steady gait

## 2018-02-14 NOTE — ED Provider Notes (Signed)
[facility] EMERGENCY DEPARTMENT Provider Note  CSN: [id number] Arrival date & time: 02/13/18  2009    History   Chief Complaint Chief Complaint  Patient presents with  . Wrist Pain    HPI Megan Hale is a 42 y.o. female with a medical history of CVA, OSA, migraines and HLD who presented to the ED for left wrist pain x1 day. She describes a dull aching sensation in the dorsal aspect of her hand that goes to the mid-forearm Patient states she first noticed it when she woke up this morning. Denies any recent trauma, injuries or falls or open wounds, abrasions. She reports that she frequent uses her left hand to push off chairs to help her stand. Denies hearing or feeling any pops, snaps or breaks. Patient has tried oral NSAIDs, but reports minimal relief. Pain is worse with movement and palpation. Denies fever, erythema, swelling, decreased ROM, temperature changes, paresthesias or weakness.  Past Medical History:  Diagnosis Date  . Anxiety   . CVA (cerebral vascular accident) [facility])    Acute left Basal Ganglia - right hemiplagia - Dr Johnell Comings  . Depression   . Hyperlipidemia   . Migraine   . Morbid obesity (HCC)   . OSA (obstructive sleep apnea)    Bi-Pap  . Vitamin D deficiency     Patient Active Problem List   Diagnosis Date Noted  . Headache(784.0) 11/02/2013  . Stroke syndrome 07/29/2013  . Pain, joint, ankle, right 07/18/2013  . Equinus deformity of foot, acquired 07/18/2013  . Abdominal pain, unspecified site 06/22/2013  . Carpal tunnel syndrome 06/22/2013  . Type II or unspecified type diabetes mellitus without mention of complication, not stated as uncontrolled 02/26/2013  . Hx of exposure to hazardous bodily fluids 02/26/2013  . Essential hypertension, benign 02/26/2013  . Other and unspecified hyperlipidemia 02/26/2013  . CVA (cerebral vascular accident) (HCC)   . Morbid obesity (HCC)   . OSA (obstructive sleep apnea)   . Migraine   . Depression   .  Anxiety   . Vitamin D deficiency     Past Surgical History:  Procedure Laterality Date  . LAPAROSCOPIC GASTRIC SLEEVE RESECTION       OB History   None      Home Medications    Prior to Admission medications   Medication Sig Start Date End Date Taking? Authorizing Provider  aspirin 325 MG tablet Take 325 mg by mouth daily.    [provider]  buPROPion (WELLBUTRIN XL) 150 MG 24 hr tablet Take 1 tablet (150 mg total) by mouth daily. 09/04/13 09/05/14  Gillian Scarce, MD  diclofenac sodium (VOLTAREN) 1 % GEL Apply 2 g topically 4 (four) times daily for 14 days. 02/13/18 02/27/18  Pacey Willadsen, Jerrel Ivory I, PA-C  FLUoxetine (PROZAC) 20 MG tablet Take 1 tablet (20 mg total) by mouth daily. 09/04/13 09/05/14  Gillian Scarce, MD  glipiZIDE (GLIPIZIDE XL) 5 MG 24 hr tablet Take 1 tablet (5 mg total) by mouth daily with breakfast. 09/04/13 09/05/14  Zanard, Hinton Dyer, MD  irbesartan-hydrochlorothiazide (AVALIDE) 300-12.5 MG per tablet Take 1 tablet by mouth daily. 09/04/13   Gillian Scarce, MD  metFORMIN (GLUCOPHAGE) 1000 MG tablet Take 1 tablet (1,000 mg total) by mouth 2 (two) times daily with a meal. 09/04/13 09/05/14  Zanard, Hinton Dyer, MD    Family History Family History  Problem Relation Age of Onset  . CVA Father 8  . Hypertension Father   . Stroke Mother   .  Heart disease Mother   . CVA Brother   . Heart attack Brother     Social History Social History   Tobacco Use  . Smoking status: Never Smoker  . Smokeless tobacco: Never Used  Substance Use Topics  . Alcohol use: Yes    Comment: rare  . Drug use: No     Allergies   Patient has no active allergies.   Review of Systems Review of Systems  Constitutional: Negative.   Musculoskeletal: Positive for arthralgias. Negative for joint swelling.  Skin: Negative for color change and wound.  Neurological: Negative for weakness and numbness.  Hematological: Negative.    Physical Exam Updated Vital Signs BP (!) 132/92 (BP  Location: Right Arm)   Pulse 84   Temp 98.9 F (37.2 C) (Oral)   Resp 20   Ht 5' 6.5" (1.689 m)   Wt 126.6 kg   LMP 01/27/2018   SpO2 98%   BMI 44.36 kg/m   Physical Exam  Constitutional: Vital signs are normal. She appears well-developed and well-nourished. She is cooperative.  Cardiovascular:  Pulses:      Radial pulses are 2+ on the right side, and 2+ on the left side.  Musculoskeletal:       Left elbow: Normal.       Left wrist: She exhibits tenderness. She exhibits normal range of motion and no bony tenderness.       Left forearm: She exhibits tenderness. She exhibits no bony tenderness and no swelling.       Left hand: She exhibits normal range of motion, no tenderness and normal capillary refill. Normal sensation noted. Normal strength noted.       Hands: Neurological: She is alert. She has normal strength. No sensory deficit. She exhibits normal muscle tone.  Reflex Scores:      Bicep reflexes are 2+ on the right side and 2+ on the left side.      Brachioradialis reflexes are 2+ on the right side and 2+ on the left side. Nursing note and vitals reviewed.    ED Treatments / Results  Labs (all labs ordered are listed, but only abnormal results are displayed) Labs Reviewed - No data to display  EKG None  Radiology Dg Wrist Complete Left  Result Date: 02/13/2018 CLINICAL DATA:  Wrist pain and swelling EXAM: LEFT WRIST - COMPLETE 3+ VIEW COMPARISON:  None. FINDINGS: There is no evidence of fracture or dislocation. There is no evidence of arthropathy or other focal bone abnormality. Soft tissues are unremarkable. IMPRESSION: Negative. Electronically Signed   By: Jasmine PangKim  Fujinaga M.D.   On: 02/13/2018 21:15    Procedures Procedures (including critical care time)  Medications Ordered in ED Medications - No data to display   Initial Impression / Assessment and Plan / ED Course  Triage vital signs and the nursing notes have been reviewed.  Pertinent labs & imaging  results that were available during care of the patient were reviewed and considered in medical decision making (see chart for details). Clinical Course as of Feb 15 939  Wed Feb 13, 2018  2120 Normal wrist x-ray. No fractures, dislocations or evidence of joint space narrowing or erosions.   [GM]    Clinical Course User Index [GM] Kayman Snuffer, Sharyon MedicusGabrielle I, PA-C   Patient is in no distress and well appearing. Patient has full sensation in right hand and digits. She also has full active and passive ROM. No deformities, decreased muscle tone or other abnormalities visualized. Neurovascular function is  intact. Physical exam and x-rays are reassuring. There are no other physical exam findings or s/s that suggest an underlying infectious or rheumatologic process that warrant further evaluation or intervention today.  Final Clinical Impressions(s) / ED Diagnoses  1. Right Wrist Pain. MSK etiology. Rx for Voltaren gel given. Education provided on OTC and supportive treatment for pain relief and inflammation.   Dispo: Home. After thorough clinical evaluation, this patient is determined to be medically stable and can be safely discharged with the previously mentioned treatment and/or outpatient follow-up/referral(s). At this time, there are no other apparent medical conditions that require further screening, evaluation or treatment.   Final diagnoses:  Acute pain of right wrist    ED Discharge Orders         Ordered    diclofenac sodium (VOLTAREN) 1 % GEL  4 times daily     02/13/18 2226            Reva Bores 02/14/18 1031    Terrilee Files, MD 02/14/18 1450

## 2020-03-05 ENCOUNTER — Other Ambulatory Visit: Payer: Self-pay

## 2020-03-05 ENCOUNTER — Encounter (HOSPITAL_BASED_OUTPATIENT_CLINIC_OR_DEPARTMENT_OTHER): Payer: Self-pay | Admitting: *Deleted

## 2020-03-05 ENCOUNTER — Emergency Department (HOSPITAL_BASED_OUTPATIENT_CLINIC_OR_DEPARTMENT_OTHER)
Admission: EM | Admit: 2020-03-05 | Discharge: 2020-03-06 | Disposition: A | Discharge status: Home / Self Care | Payer: Medicare HMO | Attending: Emergency Medicine | Admitting: Emergency Medicine

## 2020-03-05 ENCOUNTER — Emergency Department (HOSPITAL_BASED_OUTPATIENT_CLINIC_OR_DEPARTMENT_OTHER): Payer: Medicare HMO

## 2020-03-05 DIAGNOSIS — R200 Anesthesia of skin: Secondary | ICD-10-CM

## 2020-03-05 DIAGNOSIS — Z8673 Personal history of transient ischemic attack (TIA), and cerebral infarction without residual deficits: Secondary | ICD-10-CM | POA: Diagnosis not present

## 2020-03-05 DIAGNOSIS — Z7982 Long term (current) use of aspirin: Secondary | ICD-10-CM | POA: Diagnosis not present

## 2020-03-05 LAB — DIFFERENTIAL
Abs Immature Granulocytes: 0.02 10*3/uL (ref 0.00–0.07)
Basophils Absolute: 0.1 10*3/uL (ref 0.0–0.1)
Basophils Relative: 1 %
Eosinophils Absolute: 0.1 10*3/uL (ref 0.0–0.5)
Eosinophils Relative: 1 %
Immature Granulocytes: 0 %
Lymphocytes Relative: 36 %
Lymphs Abs: 3.3 10*3/uL (ref 0.7–4.0)
Monocytes Absolute: 1 10*3/uL (ref 0.1–1.0)
Monocytes Relative: 11 %
Neutro Abs: 4.7 10*3/uL (ref 1.7–7.7)
Neutrophils Relative %: 51 %

## 2020-03-05 LAB — COMPREHENSIVE METABOLIC PANEL
ALT: 12 U/L (ref 0–44)
AST: 15 U/L (ref 15–41)
Albumin: 3.7 g/dL (ref 3.5–5.0)
Alkaline Phosphatase: 70 U/L (ref 38–126)
Anion gap: 10 (ref 5–15)
BUN: 17 mg/dL (ref 6–20)
CO2: 24 mmol/L (ref 22–32)
Calcium: 8.8 mg/dL — ABNORMAL LOW (ref 8.9–10.3)
Chloride: 105 mmol/L (ref 98–111)
Creatinine, Ser: 0.83 mg/dL (ref 0.44–1.00)
GFR calc Af Amer: >60 mL/min (ref >60)
GFR calc non Af Amer: >60 mL/min (ref >60)
Glucose, Bld: 105 mg/dL — ABNORMAL HIGH (ref 70–99)
Potassium: 4.1 mmol/L (ref 3.5–5.1)
Sodium: 139 mmol/L (ref 135–145)
Total Bilirubin: 0.2 mg/dL — ABNORMAL LOW (ref 0.3–1.2)
Total Protein: 7.6 g/dL (ref 6.5–8.1)

## 2020-03-05 LAB — CBC
HCT: 39.4 % (ref 36.0–46.0)
Hemoglobin: 12.1 g/dL (ref 12.0–15.0)
MCH: 27 pg (ref 26.0–34.0)
MCHC: 30.7 g/dL (ref 30.0–36.0)
MCV: 87.9 fL (ref 80.0–100.0)
Platelets: 355 10*3/uL (ref 150–400)
RBC: 4.48 MIL/uL (ref 3.87–5.11)
RDW: 16.2 % — ABNORMAL HIGH (ref 11.5–15.5)
WBC: 9.1 10*3/uL (ref 4.0–10.5)
nRBC: 0 % (ref 0.0–0.2)

## 2020-03-05 LAB — CBG MONITORING, ED: Glucose-Capillary: 107 mg/dL — ABNORMAL HIGH (ref 70–99)

## 2020-03-05 LAB — PROTIME-INR
INR: 0.9 (ref 0.8–1.2)
Prothrombin Time: 12.1 seconds (ref 11.4–15.2)

## 2020-03-05 LAB — APTT: aPTT: 25 seconds (ref 24–36)

## 2020-03-05 MED ORDER — ACETAMINOPHEN 500 MG PO TABS
ORAL_TABLET | ORAL | Status: AC
  Filled 2020-03-05: qty 2

## 2020-03-05 MED ORDER — ACETAMINOPHEN 500 MG PO TABS
1000.0000 mg | ORAL_TABLET | Freq: Once | ORAL | Status: AC
  Administered 2020-03-05: 1000 mg via ORAL

## 2020-03-05 MED ORDER — SODIUM CHLORIDE 0.9% FLUSH
3.0000 mL | Freq: Once | INTRAVENOUS | Status: DC
  Filled 2020-03-05: qty 3

## 2020-03-05 NOTE — ED Triage Notes (Addendum)
Stroke in 2013. 2 weeks ago she started having numbness in her right arm and leg. [facility] was [facility] but later she was told she did not have a new stroke. An hour ago she is having the same numbness in her right arm and left leg. She is ambulatory. Tearful.

## 2020-03-05 NOTE — ED Provider Notes (Signed)
[facility] Provider Note: Megan Dell, MD, FACEP  CSN: [id number] MRN: [id number] ARRIVAL: 03/05/20 at 2222 ROOM: MH11/MH11   CHIEF COMPLAINT  Numbness   HISTORY OF PRESENT ILLNESS  03/05/20 10:53 PM Megan Hale is a 44 y.o. female with a history of a basal ganglia stroke in 2013.  2 weeks ago she started having numbness ("tingling, but it is hard to explain") in her right arm and left leg.  She was admitted to [facility] where a stroke work-up was negative.  Her symptoms resolved except for persistent tingling of the right thumb she is now here with the same numbness in her right arm and left leg which began about an hour prior to arrival.  Nursing staff reports she was tearful and triage.  She was immediately taken to the CT scanner before my evaluation.  She denies any pain.  She was ambulatory into the ED.  She describes her symptoms as an abnormal sensation in her right forearm and hand and her left lower leg and foot.  She states that to touch either leg or either arm feels the same but something just does not feel right.  She is not insensate.  She has no weakness.   Past Medical History:  Diagnosis Date  . Anxiety   . CVA (cerebral vascular accident) [facility])    Acute left Basal Ganglia - right hemiplagia - Dr Johnell Comings  . Depression   . Hyperlipidemia   . Migraine   . Morbid obesity (HCC)   . OSA (obstructive sleep apnea)    Bi-Pap  . Vitamin D deficiency     Past Surgical History:  Procedure Laterality Date  . LAPAROSCOPIC GASTRIC SLEEVE RESECTION      Family History  Problem Relation Age of Onset  . CVA Father 73  . Hypertension Father   . Stroke Mother   . Heart disease Mother   . CVA Brother   . Heart attack Brother     Social History   Tobacco Use  . Smoking status: Never Smoker  . Smokeless tobacco: Never Used  Substance Use Topics  . Alcohol use: Yes    Comment: rare  . Drug use: No    Prior to Admission medications     Medication Sig Start Date End Date Taking? Authorizing Provider  aspirin 325 MG tablet Take 325 mg by mouth daily.   Yes [provider]  buPROPion (WELLBUTRIN XL) 150 MG 24 hr tablet Take 1 tablet (150 mg total) by mouth daily. 09/04/13 09/05/14  Gillian Scarce, MD  FLUoxetine (PROZAC) 20 MG tablet Take 1 tablet (20 mg total) by mouth daily. 09/04/13 09/05/14  Gillian Scarce, MD  glipiZIDE (GLIPIZIDE XL) 5 MG 24 hr tablet Take 1 tablet (5 mg total) by mouth daily with breakfast. 09/04/13 09/05/14  Zanard, Hinton Dyer, MD  irbesartan-hydrochlorothiazide (AVALIDE) 300-12.5 MG per tablet Take 1 tablet by mouth daily. 09/04/13   Gillian Scarce, MD  metFORMIN (GLUCOPHAGE) 1000 MG tablet Take 1 tablet (1,000 mg total) by mouth 2 (two) times daily with a meal. 09/04/13 09/05/14  Zanard, Hinton Dyer, MD    Allergies Patient has no known allergies.   REVIEW OF SYSTEMS  Negative except as noted here or in the History of Present Illness.   PHYSICAL EXAMINATION  Initial Vital Signs Blood pressure (!) 149/122, pulse 91, temperature 98.5 F (36.9 C), temperature source Oral, resp. rate 18, height 5' 6.5" (1.689 m), weight 132.8 kg, last menstrual  period 02/20/2020, SpO2 100 %.  Examination General: Well-developed, well-nourished female in no acute distress; appearance consistent with age of record HENT: normocephalic; atraumatic Eyes: pupils equal, round and reactive to light; extraocular muscles intact Neck: supple; no bruit Heart: regular rate and rhythm Lungs: clear to auscultation bilaterally Abdomen: soft; nondistended; nontender; bowel sounds present Extremities: No deformity; full range of motion; pulses normal Neurologic: Awake, alert and oriented; motor function intact in all extremities and symmetric; sensation intact and symmetric in all extremities; no facial droop; normal coordination and speech; no pronator drift; normal finger-to-nose Skin: Warm and dry Psychiatric: Normal mood and  affect   RESULTS  Summary of this visit's results, reviewed and interpreted by myself:   EKG Interpretation  Date/Time:  Friday March 05 2020 22:41:02 EDT Ventricular Rate:  84 PR Interval:    QRS Duration: 99 QT Interval:  377 QTC Calculation: 446 R Axis:   36 Text Interpretation: Sinus rhythm Borderline prolonged PR interval Consider left atrial enlargement Low voltage, extremity and precordial leads Baseline wander in lead(s) I III aVL V6 No old tracing to compare Confirmed by Meridee Score [phone]) on 03/05/2020 10:44:42 PM      Laboratory Studies: Results for orders placed or performed during the hospital encounter of 03/05/20 (from the past 24 hour(s))  CBG monitoring, ED     Status: Abnormal   Collection Time: 03/05/20 10:44 PM  Result Value Ref Range   Glucose-Capillary 107 (H) 70 - 99 mg/dL  Protime-INR     Status: None   Collection Time: 03/05/20 10:46 PM  Result Value Ref Range   Prothrombin Time 12.1 11.4 - 15.2 seconds   INR 0.9 0.8 - 1.2  APTT     Status: None   Collection Time: 03/05/20 10:46 PM  Result Value Ref Range   aPTT 25 24 - 36 seconds  CBC     Status: Abnormal   Collection Time: 03/05/20 10:46 PM  Result Value Ref Range   WBC 9.1 4.0 - 10.5 K/uL   RBC 4.48 3.87 - 5.11 MIL/uL   Hemoglobin 12.1 12.0 - 15.0 g/dL   HCT 62.2 36 - 46 %   MCV 87.9 80.0 - 100.0 fL   MCH 27.0 26.0 - 34.0 pg   MCHC 30.7 30.0 - 36.0 g/dL   RDW 29.7 (H) 98.9 - 21.1 %   Platelets 355 150 - 400 K/uL   nRBC 0.0 0.0 - 0.2 %  Differential     Status: None   Collection Time: 03/05/20 10:46 PM  Result Value Ref Range   Neutrophils Relative % 51 %   Neutro Abs 4.7 1.7 - 7.7 K/uL   Lymphocytes Relative 36 %   Lymphs Abs 3.3 0.7 - 4.0 K/uL   Monocytes Relative 11 %   Monocytes Absolute 1.0 0 - 1 K/uL   Eosinophils Relative 1 %   Eosinophils Absolute 0.1 0 - 0 K/uL   Basophils Relative 1 %   Basophils Absolute 0.1 0 - 0 K/uL   Immature Granulocytes 0 %   Abs Immature  Granulocytes 0.02 0.00 - 0.07 K/uL  Comprehensive metabolic panel     Status: Abnormal   Collection Time: 03/05/20 10:46 PM  Result Value Ref Range   Sodium 139 135 - 145 mmol/L   Potassium 4.1 3.5 - 5.1 mmol/L   Chloride 105 98 - 111 mmol/L   CO2 24 22 - 32 mmol/L   Glucose, Bld 105 (H) 70 - 99 mg/dL   BUN 17 6 -  20 mg/dL   Creatinine, Ser 8.72 0.44 - 1.00 mg/dL   Calcium 8.8 (L) 8.9 - 10.3 mg/dL   Total Protein 7.6 6.5 - 8.1 g/dL   Albumin 3.7 3.5 - 5.0 g/dL   AST 15 15 - 41 U/L   ALT 12 0 - 44 U/L   Alkaline Phosphatase 70 38 - 126 U/L   Total Bilirubin 0.2 (L) 0.3 - 1.2 mg/dL   GFR calc non Af Amer >60 >60 mL/min   GFR calc Af Amer >60 >60 mL/min   Anion gap 10 5 - 15   Imaging Studies: CT HEAD WO CONTRAST  Result Date: 03/05/2020 CLINICAL DATA:  Right arm and right leg numbness. EXAM: CT HEAD WITHOUT CONTRAST TECHNIQUE: Contiguous axial images were obtained from the base of the skull through the vertex without intravenous contrast. COMPARISON:  February 19, 2020 FINDINGS: Brain: No evidence of acute infarction, hemorrhage, hydrocephalus, extra-axial collection or mass lesion/mass effect. Cavum septum pellucidum is noted. A small chronic infarct is seen involving the lentiform nucleus on the left. This is seen on the prior study. Vascular: No hyperdense vessel or unexpected calcification. Skull: Normal. Negative for fracture or focal lesion. Sinuses/Orbits: A stable 8 mm x 5 mm left maxillary sinus polyp versus mucous retention cyst is seen. Other: None. IMPRESSION: 1. No acute intracranial abnormality. 2. Small chronic infarct involving the lentiform nucleus on the left. 3. Small left maxillary sinus polyp versus mucous retention cyst. Electronically Signed   By: Aram Candela M.D.   On: 03/05/2020 23:03    ED COURSE and MDM  Nursing notes, initial and subsequent vitals signs, including pulse oximetry, reviewed and interpreted by myself.  Vitals:   03/05/20 2232 03/05/20  2316 03/05/20 2330 03/06/20 0000  BP: (!) 149/122 (!) 140/91 132/90 139/80  Pulse: 91 81 76 66  Resp: 18 (!) 21 (!) 21 20  Temp: 98.5 F (36.9 C)     TempSrc: Oral     SpO2: 100% 99% 99% 100%  Weight:      Height:       Medications  sodium chloride flush (NS) 0.9 % injection 3 mL (3 mLs Intravenous Not Given 03/05/20 2246)  acetaminophen (TYLENOL) tablet 1,000 mg (1,000 mg Oral Given 03/05/20 2349)   The patient has no objective neurologic abnormalities on exam.  She denies any alteration in sensation when her legs or arms or palpated but she still insists that something feels wrong in the right forearm and left lower leg.  We will consult teleneurology for a possible atypical stroke.  The symptoms are the same as those that occasion her visit to [facility] about 2 weeks ago.  12:25 AM The patient was assessed by teleneurology. The teleneurologist does not believe this is a stroke especially in light of the reassuring work-up done at [facility]. The patient has a follow-up appointment with neurology on March 24, 2020. She was advised that the teleneurologist recommends she discuss getting an EMG as an outpatient.  PROCEDURES  Procedures   ED DIAGNOSES     ICD-10-CM   1. Numbness  R20.0        Marykate Heuberger, Jonny Ruiz, MD 03/06/20 [phone]

## 2020-03-05 NOTE — ED Notes (Signed)
Patient transported to CT 

## 2020-03-06 NOTE — Consult Note (Signed)
[facility] Tele[facility]rology Consult Services  Stat Consult  Date of Service:   03/05/2020 23:10:56  Impression:       R20.2 - Paresthesia of skin  Comments/Sign-Out: The patient is a 44 year old woman with a history of hypertension, hyperlipidemia, diabetes, CVA, who presents with right arm and left leg numbness. Nonspecific presentation. Distribution does not suggest CVA and prior workup 2 weeks ago negative for CVA. No radicular symptoms. Recommend EMG.  CT HEAD: Showed No Acute Hemorrhage or Acute Core Infarct Reviewed 1. No acute intracranial abnormality. 2. Small chronic infarct involving the lentiform nucleus on the left. 3. Small left maxillary sinus polyp versus mucous retention cyst.   Metrics: [facility] Notification Time: 03/05/2020 23:09:02 Stamp Time: 03/05/2020 23:10:56 Callback Response Time: 03/05/2020 23:18:48  Our recommendations are outlined below.  Recommendations:       EMG   Disposition: Neurology Follow Up Recommended  Sign Out:       Discussed with Emergency Department Provider  ----------------------------------------------------------------------------------------------------  Chief Complaint: right arm and left leg numbness  History of Present Illness: Patient is a 44 year old Female.  Patient presents with acute onset numbness of right arm and left leg. Reports involvement of right forearm and L calf. Had similar symptoms 2 weeks ago, evaluated with MRI, C-US, TTE, which were negative for stroke. Possible recrudesence of old left basal ganglia CVA was considered. Symptoms resolved after few days, no recurrence until now. No headache, neck pain, back pain, or radicular pains. Past medical history includes obesity status post bariatric surgery, migraine, anxiety, L basal ganglia CVA in 2013   Past Medical History:      Hypertension      Diabetes Mellitus      Hyperlipidemia      Stroke   Antiplatelet use:  Plavix    Examination: BP(149/122), Pulse(76), Blood Glucose(105) 1A: Level of Consciousness - Alert; keenly responsive + 0 1B: Ask Month and Age - Both Questions Right + 0 1C: Blink Eyes & Squeeze Hands - Performs Both Tasks + 0 2: Test Horizontal Extraocular Movements - Normal + 0 3: Test Visual Fields - No Visual Loss + 0 4: Test Facial Palsy (Use Grimace if Obtunded) - Normal symmetry + 0 5A: Test Left Arm Motor Drift - No Drift for 10 Seconds + 0 5B: Test Right Arm Motor Drift - No Drift for 10 Seconds + 0 6A: Test Left Leg Motor Drift - No Drift for 5 Seconds + 0 6B: Test Right Leg Motor Drift - No Drift for 5 Seconds + 0 7: Test Limb Ataxia (FNF/Heel-Shin) - No Ataxia + 0 8: Test Sensation - Normal; No sensory loss + 0 9: Test Language/Aphasia - Normal; No aphasia + 0 10: Test Dysarthria - Normal + 0 11: Test Extinction/Inattention - No abnormality + 0  NIHSS Score: 0    Patient is being evaluated for possible acute neurologic impairment and high probability of imminent or life-threatening deterioration. I spent total of 20 minutes providing care to this patient, including time for face to face visit via telemedicine, review of medical records, imaging studies and discussion of findings with providers, the patient and/or family.   Dr Rosanne Ashing   [facility] [phone]  Case [id number]

## 2020-03-06 NOTE — ED Notes (Signed)
Tele consult in progress

## 2020-03-06 NOTE — Discharge Instructions (Signed)
Follow-up with your neurologist on October 20 as scheduled. Ask about getting an EMG for further evaluation of your symptoms.

## 2020-07-11 ENCOUNTER — Other Ambulatory Visit: Payer: Self-pay

## 2020-07-11 ENCOUNTER — Encounter (HOSPITAL_BASED_OUTPATIENT_CLINIC_OR_DEPARTMENT_OTHER): Payer: Self-pay | Admitting: Emergency Medicine

## 2020-07-11 ENCOUNTER — Emergency Department (HOSPITAL_BASED_OUTPATIENT_CLINIC_OR_DEPARTMENT_OTHER)
Admission: EM | Admit: 2020-07-11 | Discharge: 2020-07-12 | Disposition: A | Discharge status: Home / Self Care | Payer: Medicare HMO | Attending: Emergency Medicine | Admitting: Emergency Medicine

## 2020-07-11 ENCOUNTER — Emergency Department (HOSPITAL_BASED_OUTPATIENT_CLINIC_OR_DEPARTMENT_OTHER): Payer: Medicare HMO

## 2020-07-11 DIAGNOSIS — Z7984 Long term (current) use of oral hypoglycemic drugs: Secondary | ICD-10-CM | POA: Diagnosis not present

## 2020-07-11 DIAGNOSIS — I10 Essential (primary) hypertension: Secondary | ICD-10-CM | POA: Diagnosis not present

## 2020-07-11 DIAGNOSIS — Z7982 Long term (current) use of aspirin: Secondary | ICD-10-CM | POA: Insufficient documentation

## 2020-07-11 DIAGNOSIS — Z7902 Long term (current) use of antithrombotics/antiplatelets: Secondary | ICD-10-CM | POA: Insufficient documentation

## 2020-07-11 DIAGNOSIS — E119 Type 2 diabetes mellitus without complications: Secondary | ICD-10-CM | POA: Diagnosis not present

## 2020-07-11 DIAGNOSIS — R200 Anesthesia of skin: Secondary | ICD-10-CM | POA: Insufficient documentation

## 2020-07-11 DIAGNOSIS — Z7901 Long term (current) use of anticoagulants: Secondary | ICD-10-CM | POA: Insufficient documentation

## 2020-07-11 LAB — DIFFERENTIAL
Abs Immature Granulocytes: 0.01 10*3/uL (ref 0.00–0.07)
Basophils Absolute: 0 10*3/uL (ref 0.0–0.1)
Basophils Relative: 1 %
Eosinophils Absolute: 0.1 10*3/uL (ref 0.0–0.5)
Eosinophils Relative: 1 %
Immature Granulocytes: 0 %
Lymphocytes Relative: 45 %
Lymphs Abs: 3.7 10*3/uL (ref 0.7–4.0)
Monocytes Absolute: 0.9 10*3/uL (ref 0.1–1.0)
Monocytes Relative: 11 %
Neutro Abs: 3.3 10*3/uL (ref 1.7–7.7)
Neutrophils Relative %: 42 %

## 2020-07-11 LAB — CBC
HCT: 37.8 % (ref 36.0–46.0)
Hemoglobin: 11.9 g/dL — ABNORMAL LOW (ref 12.0–15.0)
MCH: 26.9 pg (ref 26.0–34.0)
MCHC: 31.5 g/dL (ref 30.0–36.0)
MCV: 85.5 fL (ref 80.0–100.0)
Platelets: 337 10*3/uL (ref 150–400)
RBC: 4.42 MIL/uL (ref 3.87–5.11)
RDW: 14.7 % (ref 11.5–15.5)
WBC: 8 10*3/uL (ref 4.0–10.5)
nRBC: 0 % (ref 0.0–0.2)

## 2020-07-11 LAB — CBG MONITORING, ED: Glucose-Capillary: 79 mg/dL (ref 70–99)

## 2020-07-11 MED ORDER — SODIUM CHLORIDE 0.9% FLUSH
3.0000 mL | Freq: Once | INTRAVENOUS | Status: DC
  Filled 2020-07-11: qty 3

## 2020-07-11 NOTE — ED Provider Notes (Signed)
[facility] EMERGENCY DEPARTMENT Provider Note   CSN: [id number] Arrival date & time: 07/11/20  2321  An emergency department physician performed an initial assessment on this suspected stroke patient at 2332.  History No chief complaint on file.   Megan Hale is a 45 y.o. female.  HPI     This 45 year old female with a history of CVA of the left basal ganglia, hyperlipidemia, morbid obesity, obstructive sleep apnea who presents with numbness.  Patient reports that she was in her normal state of health until 8:30 PM.  At 830 she noted some right-sided facial numbness.  She did not note any facial droop, difficulty speaking, unilateral weakness unchanged from prior.  Patient had a similar episode with some sensory abnormalities in her upper extremities in the fall 2021.  At that time she was admitted to [facility] and had a negative stroke work-up.  She reports compliance with her Plavix.  She is not had any recent illnesses or fevers.  No pain.  Denies headache.  Patient is within 4 hours of onset of symptoms.  Code stroke was initiated by triage nurse.  Patient has already gone to the CT scanner.  CT scan without acute findings.  Teleneurology to evaluate patient emergently.  Level 5 caveat for acuity of condition  Past Medical History:  Diagnosis Date  . Anxiety   . CVA (cerebral vascular accident) [facility])    Acute left Basal Ganglia - right hemiplagia - Dr Johnell Comings  . Depression   . Hyperlipidemia   . Migraine   . Morbid obesity (HCC)   . OSA (obstructive sleep apnea)    Bi-Pap  . Vitamin D deficiency     Patient Active Problem List   Diagnosis Date Noted  . Headache(784.0) 11/02/2013  . Stroke syndrome 07/29/2013  . Pain, joint, ankle, right 07/18/2013  . Equinus deformity of foot, acquired 07/18/2013  . Abdominal pain, unspecified site 06/22/2013  . Carpal tunnel syndrome 06/22/2013  . Type II or unspecified type diabetes mellitus without mention  of complication, not stated as uncontrolled 02/26/2013  . Hx of exposure to hazardous bodily fluids 02/26/2013  . Essential hypertension, benign 02/26/2013  . Other and unspecified hyperlipidemia 02/26/2013  . CVA (cerebral vascular accident) (HCC)   . Morbid obesity (HCC)   . OSA (obstructive sleep apnea)   . Migraine   . Depression   . Anxiety   . Vitamin D deficiency     Past Surgical History:  Procedure Laterality Date  . LAPAROSCOPIC GASTRIC SLEEVE RESECTION       OB History   No obstetric history on file.     Family History  Problem Relation Age of Onset  . CVA Father 78  . Hypertension Father   . Stroke Mother   . Heart disease Mother   . CVA Brother   . Heart attack Brother     Social History   Tobacco Use  . Smoking status: Never Smoker  . Smokeless tobacco: Never Used  Substance Use Topics  . Alcohol use: Yes    Comment: rare  . Drug use: No    Home Medications Prior to Admission medications   Medication Sig Start Date End Date Taking? Authorizing Provider  aspirin 325 MG tablet Take 325 mg by mouth daily.    [provider]  atorvastatin (LIPITOR) 40 MG tablet  05/27/20   [provider]  buPROPion (WELLBUTRIN XL) 150 MG 24 hr tablet Take 1 tablet (150 mg total) by mouth  daily. 09/04/13 09/05/14  Gillian Scarce, MD  citalopram (CELEXA) 10 MG tablet Take 10 mg by mouth daily. 06/29/20   [provider]  clopidogrel (PLAVIX) 75 MG tablet Take 75 mg by mouth daily. 05/27/20   [provider]  FLUoxetine (PROZAC) 20 MG tablet Take 1 tablet (20 mg total) by mouth daily. 09/04/13 09/05/14  Gillian Scarce, MD  glipiZIDE (GLIPIZIDE XL) 5 MG 24 hr tablet Take 1 tablet (5 mg total) by mouth daily with breakfast. 09/04/13 09/05/14  Zanard, Hinton Dyer, MD  irbesartan-hydrochlorothiazide (AVALIDE) 300-12.5 MG per tablet Take 1 tablet by mouth daily. 09/04/13   Gillian Scarce, MD  metFORMIN (GLUCOPHAGE) 1000 MG tablet Take 1 tablet (1,000 mg  total) by mouth 2 (two) times daily with a meal. 09/04/13 09/05/14  Zanard, Hinton Dyer, MD  OZEMPIC, 0.25 OR 0.5 MG/DOSE, 2 MG/1.5ML SOPN Inject into the skin. 06/16/20   [provider]    Allergies    Patient has no known allergies.  Review of Systems   Review of Systems  Constitutional: Negative for fever.  Respiratory: Negative for shortness of breath.   Cardiovascular: Negative for chest pain.  Gastrointestinal: Negative for abdominal pain, nausea and vomiting.  Neurological: Positive for numbness. Negative for speech difficulty, weakness and headaches.  All other systems reviewed and are negative.   Physical Exam Updated Vital Signs BP (!) 148/87   Pulse 84   Temp 98.1 F (36.7 C) (Oral)   Resp 18   Wt 135.2 kg   LMP 07/05/2020 (Exact Date)   SpO2 100%   BMI 47.39 kg/m   Physical Exam Vitals and nursing note reviewed.  Constitutional:      Appearance: She is well-developed and well-nourished. She is obese.     Comments: Morbidly obese  HENT:     Head: Normocephalic and atraumatic.     Mouth/Throat:     Mouth: Mucous membranes are moist.  Eyes:     Pupils: Pupils are equal, round, and reactive to light.  Cardiovascular:     Rate and Rhythm: Normal rate and regular rhythm.  Pulmonary:     Effort: Pulmonary effort is normal. No respiratory distress.  Abdominal:     General: Bowel sounds are normal.     Palpations: Abdomen is soft.     Tenderness: There is no abdominal tenderness.  Musculoskeletal:     Cervical back: Neck supple.     Right lower leg: No edema.     Left lower leg: No edema.  Skin:    General: Skin is warm and dry.  Neurological:     Mental Status: She is alert and oriented to person, place, and time.     Comments: No facial droop noted, fluent speech, cranial nerves II through XII intact, no objective sensory deficits, 5 out of 5 strength in all 4 extremities  Psychiatric:        Mood and Affect: Mood and affect normal.     ED Results  / Procedures / Treatments   Labs (all labs ordered are listed, but only abnormal results are displayed) Labs Reviewed  CBC - Abnormal; Notable for the following components:      Result Value   Hemoglobin 11.9 (*)    All other components within normal limits  COMPREHENSIVE METABOLIC PANEL - Abnormal; Notable for the following components:   Creatinine, Ser 1.26 (*)    GFR, Estimated 53 (*)    All other components within normal limits  PROTIME-INR  APTT  DIFFERENTIAL  PREGNANCY, URINE  CBG MONITORING, ED    EKG EKG Interpretation  Date/Time:  Sunday July 11 2020 23:48:17 EST Ventricular Rate:  96 PR Interval:    QRS Duration: 85 QT Interval:  343 QTC Calculation: 434 R Axis:   44 Text Interpretation: Sinus rhythm Prolonged PR interval Low voltage, precordial leads Confirmed by Ross Marcus ([id number]) on 07/12/2020 12:44:01 AM   Radiology CT HEAD CODE STROKE WO CONTRAST  Result Date: 07/11/2020 CLINICAL DATA:  Code stroke.  Numbness of the right face EXAM: CT HEAD WITHOUT CONTRAST TECHNIQUE: Contiguous axial images were obtained from the base of the skull through the vertex without intravenous contrast. COMPARISON:  03/05/2020 FINDINGS: Brain: No focal abnormality affects the brainstem or cerebellum. Right cerebral hemisphere appears normal. Old infarction on the left affecting the external capsule and radiating white matter tracts. No sign of acute infarction, mass lesion, hemorrhage, hydrocephalus or extra-axial collection. Vascular: There is atherosclerotic calcification of the major vessels at the base of the brain. Skull: Negative Sinuses/Orbits: Clear/normal Other: None ASPECTS (Alberta Stroke Program Early CT Score) - Ganglionic level infarction (caudate, lentiform nuclei, internal capsule, insula, M1-M3 cortex): 7 - Supraganglionic infarction (M4-M6 cortex): 3 Total score (0-10 with 10 being normal): 10 IMPRESSION: 1. No acute finding by CT. Old infarction on the left  affecting the external capsule and radiating white matter tracts. 2. ASPECTS is 10. 3. These results were called by telephone at the time of interpretation on 07/11/2020 at 11:44 pm to provider Rubi Tooley , who verbally acknowledged these results. Electronically Signed   By: Paulina Fusi M.D.   On: 07/11/2020 23:44    Procedures Procedures   Medications Ordered in ED Medications - No data to display  ED Course  I have reviewed the triage vital signs and the nursing notes.  Pertinent labs & imaging results that were available during my care of the patient were reviewed by me and considered in my medical decision making (see chart for details).  Clinical Course as of 07/12/20 0042  Mon Jul 12, 2020  0041 Spoke with teleneurology. Given prior history of stroke, recommend MRI. She recently had a full stroke work-up including carotid Dopplers and echocardiogram which I reviewed from October 2021. If MRI is negative, felt that she can be discharged home with neurology follow-up. Consider dual antiplatelet with addition of aspirin. [CH]  0041 Patient accepted ED to ED to [facility] for MRI. [CH]    Clinical Course User Index [CH] Angie Hogg, Mayer Masker, MD   MDM Rules/Calculators/A&P                          Patient presents with right facial numbness. She was code stroke from triage given her history of stroke. No other obvious objective neurologic deficits on exam. Was admitted to [facility] in October 2021 with numbness of the extremities and had a negative work-up at that time. She reports compliance with Plavix. She is otherwise nontoxic-appearing. Afebrile. Vital signs are largely reassuring. Lab work-up reviewed and without significant abnormality. CT scan does not show any evidence of acute bleed. EKG shows sinus rhythm without any evidence of ischemia or arrhythmia. See clinical course above. Recommendation for MRI. If negative, patient can be discharged home with neurology  follow-up. Consider dual antiplatelet therapy. Discussed with Dr. Preston Fleeting, [facility], ED. Will plan for ED to ED transfer.  Final Clinical Impression(s) / ED Diagnoses Final diagnoses:  Numbness  Rx / DC Orders ED Discharge Orders    None       Yanelli Zapanta, Mayer Masker, MD 07/12/20 [phone]

## 2020-07-11 NOTE — ED Triage Notes (Addendum)
Reports numbness to right face onset this evening. Hx stroke. No facial droop, no unilateral weakness. LSW 2030.

## 2020-07-12 ENCOUNTER — Emergency Department (HOSPITAL_COMMUNITY): Payer: Medicare HMO

## 2020-07-12 DIAGNOSIS — Z7902 Long term (current) use of antithrombotics/antiplatelets: Secondary | ICD-10-CM | POA: Diagnosis not present

## 2020-07-12 DIAGNOSIS — Z7982 Long term (current) use of aspirin: Secondary | ICD-10-CM | POA: Diagnosis not present

## 2020-07-12 DIAGNOSIS — Z7984 Long term (current) use of oral hypoglycemic drugs: Secondary | ICD-10-CM | POA: Diagnosis not present

## 2020-07-12 DIAGNOSIS — E119 Type 2 diabetes mellitus without complications: Secondary | ICD-10-CM | POA: Diagnosis not present

## 2020-07-12 DIAGNOSIS — I10 Essential (primary) hypertension: Secondary | ICD-10-CM | POA: Diagnosis not present

## 2020-07-12 DIAGNOSIS — R200 Anesthesia of skin: Secondary | ICD-10-CM | POA: Diagnosis present

## 2020-07-12 LAB — COMPREHENSIVE METABOLIC PANEL
ALT: 13 U/L (ref 0–44)
AST: 26 U/L (ref 15–41)
Albumin: 3.7 g/dL (ref 3.5–5.0)
Alkaline Phosphatase: 54 U/L (ref 38–126)
Anion gap: 11 (ref 5–15)
BUN: 19 mg/dL (ref 6–20)
CO2: 24 mmol/L (ref 22–32)
Calcium: 9 mg/dL (ref 8.9–10.3)
Chloride: 104 mmol/L (ref 98–111)
Creatinine, Ser: 1.26 mg/dL — ABNORMAL HIGH (ref 0.44–1.00)
GFR, Estimated: 53 mL/min — ABNORMAL LOW (ref >60)
Glucose, Bld: 85 mg/dL (ref 70–99)
Potassium: 4.3 mmol/L (ref 3.5–5.1)
Sodium: 139 mmol/L (ref 135–145)
Total Bilirubin: 0.4 mg/dL (ref 0.3–1.2)
Total Protein: 7.8 g/dL (ref 6.5–8.1)

## 2020-07-12 LAB — PROTIME-INR
INR: 1 (ref 0.8–1.2)
Prothrombin Time: 13.2 seconds (ref 11.4–15.2)

## 2020-07-12 LAB — APTT: aPTT: 26 seconds (ref 24–36)

## 2020-07-12 LAB — PREGNANCY, URINE: Preg Test, Ur: NEGATIVE

## 2020-07-12 MED ORDER — ASPIRIN 81 MG PO CHEW: 81.0000 mg | CHEWABLE_TABLET | Freq: Every day | ORAL | 0 refills | Status: DC

## 2020-07-12 NOTE — Discharge Instructions (Signed)
Please follow-up with your neurologist this coming week.  For now recommend continuing your Plavix and adding a daily baby aspirin.  Please discuss your ongoing medication regimen with your primary neurologist.  If you have any new numbness weakness, vision changes or speech changes, return immediately to ER for reassessment.

## 2020-07-12 NOTE — ED Provider Notes (Signed)
Brief update note  45 year old lady with history of CVA, hyperlipidemia, obesity, OSA presenting to ER with concern for facial numbness.  Patient reports episode lasted around 4 hours.  Seen in ER initially at [facility].  Teleneurologist recommended MRI, if positive for acute stroke admit, if negative discharge on dual antiplatelet therapy.  Recently completed stroke work-up at [facility] for similar episode.  Patient regularly on Plavix daily.    8:10 AM reviewed results of the MRI, no acute intracranial abnormality, reviewed case with Dr. Otelia Limes, clarified teleneurology recommendations regarding DAPT.  He recommends adding 81 mg baby aspirin and close outpatient follow-up with her primary neurologist   8:18 AM reassessed patient, she has no ongoing symptoms, discussed results and discharged, patient reports she has appointment with her neurologist on Friday    After the discussed management above, the patient was determined to be safe for discharge.  The patient was in agreement with this plan and all questions regarding their care were answered.  ED return precautions were discussed and the patient will return to the ED with any significant worsening of condition.    Milagros Loll, MD 07/12/20 [phone]

## 2020-07-12 NOTE — ED Notes (Signed)
Pt reports increased stress- death in the family today. Mother at bedside. Awaiting tele neuro assessment.

## 2020-07-12 NOTE — ED Notes (Signed)
Report given to Shanda Bumps RN at [facility] ED. Patient and mother aware of transfer.

## 2020-07-12 NOTE — Consult Note (Signed)
[facility] TeleNeurology Consult Services   Date of Service:   07/11/2020 23:47:19  Diagnosis:     .  R29.810 - Facial numbness/ Facial weakness  Impression: 45 yo F h/o HTN, hL, previous stroke with residual righat arm and leg weakness, migraine headaches, anxiety, depression presents with right face numbness around the mouth and next to the right eye while sitting in bed and talking on the phone, last known well 2000. Symptoms are persistent. She did not have right face numbness with previous stroke.  Metrics: Last Known Well: 07/11/2020 20:00:00 [facility] Notification Time: 07/11/2020 23:47:19 Arrival Time: 07/11/2020 23:21:00 Stamp Time: 07/11/2020 23:47:19 Telephone Response Time: 07/11/2020 23:50:59 Initial Response Time: 07/11/2020 23:50:59 Symptoms: right face numbness. NIHSS Start Assessment Time: 07/12/2020 00:11:21 Patient is not a candidate for Thrombolytic. Thrombolytic Medical Decision: 07/12/2020 00:11:22 Patient was not deemed candidate for Thrombolytic because of following reasons: Resolved symptoms (no residual disabling symptoms).  CT head showed no acute hemorrhage or acute core infarct.  ED Physician notified of diagnostic impression and management plan on 07/12/2020 00:30:08  Advanced Imaging: Advanced Imaging Not Recommended because:  Clinical Presentation is not Suggestive of LVO and NIHSS is <6   Our recommendations are outlined below.  Recommendations: MRI Br, dual antiplatelet, no need to repeat full stroke wu if MRI neg.     .  Stroke/Telemetry Floor     .  Neuro Checks     .  Bedside Swallow Eval     .  DVT Prophylaxis     .  IV Fluids, Normal Saline     .  Head of Bed 30 Degrees     .  Euglycemia and Avoid Hyperthermia (PRN Acetaminophen)     .  Initiate dual antiplatelet therapy with Aspirin 81 mg daily and Clopidogrel 75 mg daily.  Routine Consultation with Inhouse Neurology for Follow up Care  Sign Out:     .  Discussed  with Emergency Department Provider    ------------------------------------------------------------------------------  History of Present Illness: Patient is a 45 year old Female.  Patient was brought by private transportation with symptoms of right face numbness.  45 yo F h/o HTN, hL, previous stroke with residual righat arm and leg weakness, migraine headaches, anxiety, depression presents with right face numbness around the mouth and next to the right eye while sitting in bed and talking on the phone, last known well 2000. Symptoms are persistent. She did not have right face numbness with previous stroke.   Past Medical History:     . Hypertension     . Hyperlipidemia     . Stroke  Anticoagulant use:  No  Antiplatelet use: Yes plavix  Allergies:  Reviewed    Examination: BP(152/98), Pulse(86), Blood Glucose(79) 1A: Level of Consciousness - Alert; keenly responsive + 0 1B: Ask Month and Age - Both Questions Right + 0 1C: Blink Eyes & Squeeze Hands - Performs Both Tasks + 0 2: Test Horizontal Extraocular Movements - Normal + 0 3: Test Visual Fields - No Visual Loss + 0 4: Test Facial Palsy (Use Grimace if Obtunded) - Normal symmetry + 0 5A: Test Left Arm Motor Drift - No Drift for 10 Seconds + 0 5B: Test Right Arm Motor Drift - No Drift for 10 Seconds + 0 6A: Test Left Leg Motor Drift - No Drift for 5 Seconds + 0 6B: Test Right Leg Motor Drift - No Drift for 5 Seconds + 0 7: Test Limb Ataxia (FNF/Heel-Shin) - No Ataxia + 0  8: Test Sensation - Normal; No sensory loss + 0 9: Test Language/Aphasia - Normal; No aphasia + 0 10: Test Dysarthria - Normal + 0 11: Test Extinction/Inattention - No abnormality + 0  NIHSS Score: 0  Pre-Morbid Modified Rankin Scale: 2 Points = Slight disability; unable to carry out all previous activities, but able to look after own affairs without assistance   Patient/Family was informed the Neurology Consult would occur via TeleHealth consult  by way of interactive audio and video telecommunications and consented to receiving care in this manner.   Patient is being evaluated for possible acute neurologic impairment and high probability of imminent or life-threatening deterioration. I spent total of 30 minutes providing care to this patient, including time for face to face visit via telemedicine, review of medical records, imaging studies and discussion of findings with providers, the patient and/or family.   Dr Jordan Likes   [facility] [phone]  Case [id number]

## 2020-10-07 ENCOUNTER — Encounter: Payer: Self-pay | Admitting: Neurology

## 2020-10-07 ENCOUNTER — Ambulatory Visit: Payer: Medicare HMO | Admitting: Neurology

## 2020-10-07 VITALS — BP 124/87 | HR 86 | Ht 66.0 in | Wt 294.0 lb

## 2020-10-07 DIAGNOSIS — I639 Cerebral infarction, unspecified: Secondary | ICD-10-CM | POA: Diagnosis not present

## 2020-10-07 DIAGNOSIS — G459 Transient cerebral ischemic attack, unspecified: Secondary | ICD-10-CM

## 2020-10-07 NOTE — Patient Instructions (Signed)
Sleep study: Sleep apnea has a risk of stroke Heart monitor 30 days for atrial fibrillation(Afib). If the heart monitor is negative then recommend a loop recorder to monitor for afib for 2-3 years Transcranial Doppler(TCD) (for patent foramen ovale) CTA(CT Angiogram) to look at the blood vessels of the head and neck Possibility of Recrudescence of old stroke   Transient Ischemic Attack A transient ischemic attack (TIA) causes the same symptoms as a stroke, but the symptoms go away quickly. A TIA happens when blood flow to the brain is blocked. Having a TIA means you may be at risk for a stroke. A TIA is a medical emergency. What are the causes? A TIA is caused by a blocked artery in the head or neck. This means the brain does not get the blood supply it needs. A blockage can be caused by:  Fatty buildup in an artery in the head or neck.  A blood clot.  A tear in an artery.  Irritation and swelling (inflammation) of an artery. Sometimes the cause is not known. What increases the risk? Certain things may make you more likely to have a TIA. Some of these are things that you can change, such as:  Being very overweight.  Using products that have nicotine or tobacco.  Taking birth control pills.  Not being active.  Drinking too much alcohol.  Using drugs. Health conditions that may increase your risk include:  High blood pressure.  High cholesterol.  Diabetes.  Heart disease.  A heartbeat that is not regular (atrial fibrillation).  Sickle cell disease.  Sleep problems (sleep apnea).  Long-term diseases that cause irritation and swelling.  Problems with blood clotting. Other risk factors include:  Being over the age of 26.  Being female.  Having a family history of stroke.  Having had blood clots, stroke, TIA, or heart attack in the past.  Having a history of high blood pressure when pregnant (preeclampsia).  Very bad headaches (migraines). What are the signs  or symptoms? The symptoms of a TIA are like those of a stroke. They can include:  Weakness or loss of feeling in your face, arm, or leg. This often happens on one side of your body.  Trouble walking.  Trouble moving your arms or legs.  Trouble talking or understanding what people are saying.  Problems with how you see.  Feeling dizzy.  Feeling confused.  Loss of balance or coordination.  Feeling like you may vomit (nausea) or you vomit.  Having a very bad headache. If you can, note what time you started to have symptoms. Tell your doctor.   How is this treated? The goal of treatment is to lower the risk for a stroke. This may include:  Changes to diet and lifestyle, such as getting regular exercise and stopping smoking.  Taking medicines to: ? Thin the blood. ? Lower blood pressure. ? Lower cholesterol.  Treating other health conditions, such as diabetes. If testing shows that an artery in your brain is narrow, your doctor may recommend a procedure to:  Take the blockage out of your artery (carotid endarterectomy).  Open or widen an artery in your neck (carotid angioplasty and stenting). Follow these instructions at home: Medicines  Take over-the-counter and prescription medicines only as told by your doctor.  If you were told to take aspirin or another medicine to thin your blood, take it exactly as told by your doctor. ? Taking too much of the medicine can cause bleeding. ? Taking too little  of the medicine may not work to treat the problem. Eating and drinking  Eat 5 or more servings of fruits and vegetables each day.  Follow instructions from your doctor about your diet. You may need to follow a certain diet to help lower your risk of a stroke. You may need to: ? Eat a diet that is low in fat and salt. ? Eat foods with a lot of fiber. ? Limit carbohydrates and sugar.  If you drink alcohol: ? Limit how much you have to:  0-1 drink a day for women who are  not pregnant.  0-2 drinks a day for men. ? Know how much alcohol is in a drink. In the [place] one drink equals one 12 oz bottle of beer ( ), one 5 oz glass of wine ( ), or one 1 oz glass of hard liquor (31mL).   General instructions  Keep a healthy weight.  Try to get at least 30 minutes of exercise on most days.  Get treatment if you have sleep problems.  Do not smoke or use any products that contain nicotine or tobacco. If you need help quitting, ask your doctor.  Do not use drugs.  Keep all follow-up visits. Where to find more information  American Stroke Association: www.stroke.org Get help right away if:  You have chest pain.  You have a heartbeat that is not regular.  You have any signs of a stroke. "BE FAST" is an easy way to remember the main warning signs: ? B - Balance. Dizziness, sudden trouble walking, or loss of balance. ? E - Eyes. Trouble seeing or a change in how you see. ? F - Face. Sudden weakness or loss of feeling of the face. The face or eyelid may droop on one side. ? A - Arms. Weakness or loss of feeling in an arm. This happens all of a sudden and most often on one side of the body. ? S - Speech. Sudden trouble speaking, slurred speech, or trouble understanding what people say. ? T - Time. Time to call emergency services. Write down what time symptoms started.  You have other signs of a stroke, such as: ? A sudden, very bad headache with no known cause. ? Feeling like you may vomit. ? Vomiting. ? A seizure. These symptoms may be an emergency. Get help right away. Call your local emergency services (911 in the [place]  Do not wait to see if the symptoms will go away.  Do not drive yourself to the hospital. Summary  A transient ischemic attack (TIA) happens when an artery in the head or neck is blocked. This causes the same symptoms as a stroke. The symptoms go away quickly.  A TIA is a medical emergency. Get help right away, even if your  symptoms go away.  Having a TIA means that you may be at risk for a stroke. Taking medicines and making diet and lifestyle changes can help to prevent a stroke. This information is not intended to replace advice given to you by your health care provider. Make sure you discuss any questions you have with your health care provider. Document Revised: 12/16/2019 Document Reviewed: 12/16/2019 Elsevier Patient Education  2021 [employer].

## 2020-10-07 NOTE — Progress Notes (Signed)
[facility]    Provider:  Dr Lucia Gaskins Requesting Provider: Shellia Cleverly, PA Primary Care Provider:  Shellia Cleverly, PA  CC:  Generalized headache, facial numbness  HPI:  Megan Hale is a 45 y.o. female here as requested by Shellia Cleverly, PA for generalized headaches.  Past medical history hypertension, hypercholesterolemia, morbid obesity, CVA  causing right-sided foot drop and mild weakness, episodes of tingling in the right side of the face mouth and arm leg "off and on", then weakness of the right arm and leg and a facial droop, no sensory symptoms with the stroke. She had surgery and lost weight. Her aic improved, she managed her cholesterol.    Her alone and reports: In 2013([facility]), she was over 400 pounds at the time, she had a stroke in April and she weighed 410, she was working a lot, not sleeping, not taking care of herself, she had a stroke on Sunday she was sitting home and using her mouse, she started having difficulty with her right arm, clumsiness, she went to the ED, in 02/2020([facility]) she had acute numbness in the right arm and right leg, she went to the emergency room. In October she has acute right arm and left leg numbness, she had an emg/ncs at [facility] and was negative.  Patient denies headaches, never has headaches. Never had migraines. When she has had the episodes there was no headache associated. And she has never had headaches associated with auras. No migraines. She has a headache infrequently and can be attributed to sinus pressure, no hx of migraines. Sister with migraines without aura.   3rd in February it was just her face on the right with sensory changes.   I reviewed Titus Mould notes: Patient has chronic rhinitis controlled on nasal sprays, patient was seen at the hospital, per notes she had a negative CT and MRI, she has morbid obesity, history of CVA without residual deficits on Plavix and improving blood  pressure control with medication management, referral to neurology, for this and also for generalized headaches, she also has facial numbness tingling numbness in the face, right arm and right leg but this appears to have been ongoing since 2017 after she had a stroke which caused right-sided foot drop and mild weakness, although the episodes of tingling in the right side of the face mouth and arm leg off-and-on has gone to the ER in September October and again in February 2022.  She was also started on Lipitor.  She is trying to do better with weight loss., she is also been getting more headaches, discussed the possibility of atypical migraines hemiplegic migraines, referral to neurology.  Hemoglobin A1c December 2021 5.6, never smoked, I reviewed Lawson Fiscal Bean's examination which showed normal head eyes neck heart lungs musculoskeletal neurologic pulses skin and psychiatric.  Blood work September 2021 showed normal CBC, CMP also appears normal with creatinine 0.72 and BUN 13, TSH 0.42, hemoglobin A1c 5.6, BUN 13.  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  MRI of the  brain showed no acute intracranial abnormality, it did show a chronic lacunar infarct of the left corona radiata tracking through the internal capsule and lentiform with facilitated diffusion throughout.  Partially empty sella.  Outside of the left deep white matter and basal ganglia gray and white matter signal is within normal limits for age.  No cortical encephalomalacia  or chronic cerebral blood products identified.  Chronic lacunar infarct in the left deep white matter and lentiform corresponding to the head CT, partially empty sella, often a normal attempt a variant that can be associated with idiopathic intracranial hypertension.  Echocardiogram September 2021 negative bubble study, left ventricular systolic function is normal, ejection fraction 60-65, the right ventricle is mildly dilated, negative bubble study.  Carotid duplex February 20, 2020 bilateral no significant atherosclerosis or stenosis of the right common or left common carotid artery.  Anterograde flow was noted in the left and right vertebral arteries.  Reviewed notes, labs and imaging from outside physicians, which showed: see above  Review of Systems: Patient complains of symptoms per HPI as well as the following symptoms: episodes of numbness. Pertinent negatives and positives per HPI. All others negative.   Social History   Socioeconomic History  . Marital status: Single    Spouse name: Not on file  . Number of children: 0  . Years of education: 3016  . Highest education level: Not on file  Occupational History  . Occupation: CUSTOMER SERVICE     Employer: [employer]    Comment: LONG TERM DISABILITY   Tobacco Use  . Smoking status: Never Smoker  . Smokeless tobacco: Never Used  Vaping Use  . Vaping Use: Never used  Substance and Sexual Activity  . Alcohol use: Not Currently    Comment: rare  . Drug use: No  . Sexual activity: Not on file  Other Topics Concern  . Not on file  Social History Narrative   Marital Status:  Single   Children:  None (She would like to adopt a little girl [blonde hair/blue eyes]).   Pets:   None    Living Situation: Her mother lived with her for 1.5 yrs after patient had her CVA.  Her mom moved back to her own home.     Occupation: She is currently out on LTD with ([employer]). She previously worked in Clinical biochemistcustomer service. She is trying  to arrange it so that she can work from home.     Education: Engineer, maintenance (IT)College Graduate (BA - History) [employer]    Tobacco Use/Exposure:  None    Alcohol Use:  Occasional    Drug Use:  None   Diet:  Regular   Exercise:  Limited    Hobbies:  Poetry, Museums       No caffeine   Left handed now since stroke       Social Determinants of Health   Financial Resource Strain: Not on file  Food Insecurity: Not on file  Transportation Needs: Not on file  Physical Activity: Not on file  Stress: Not on file  Social Connections: Not on file  Intimate Partner Violence: Not on file    Family History  Problem Relation Age of Onset  . CVA Father 6037  . Hypertension Father   . Stroke Mother   . Heart disease Mother   . Diabetes Mother   . CVA Brother   . Diabetes Brother        died of complications from Diabetes  . Diabetes Sister   .  Heart attack Brother   . Diabetes Brother   . Stroke Paternal Grandfather     Past Medical History:  Diagnosis Date  . Anxiety   . CVA (cerebral vascular accident) [facility])    Acute left Basal Ganglia - right hemiplagia - Dr Johnell Comings  . Depression   . Hyperlipidemia   . Hypertension   . Migraine   . Morbid obesity (HCC)   . OSA (obstructive sleep apnea)    Bi-Pap  . Vitamin D deficiency     Patient Active Problem List   Diagnosis Date Noted  . Headache(784.0) 11/02/2013  . Stroke syndrome 07/29/2013  . Pain, joint, ankle, right 07/18/2013  . Equinus deformity of foot, acquired 07/18/2013  . Abdominal pain, unspecified site 06/22/2013  . Carpal tunnel syndrome 06/22/2013  . Type II or unspecified type diabetes mellitus without mention of complication, not stated as uncontrolled 02/26/2013  . Hx of exposure to hazardous bodily fluids 02/26/2013  . Essential hypertension, benign 02/26/2013  . Other and unspecified hyperlipidemia 02/26/2013  . CVA (cerebral vascular accident) (HCC)   . Morbid obesity (HCC)   . OSA (obstructive sleep apnea)   . Depression    . Anxiety   . Vitamin D deficiency     Past Surgical History:  Procedure Laterality Date  . LAPAROSCOPIC GASTRIC SLEEVE RESECTION      Current Outpatient Medications  Medication Sig Dispense Refill  . atorvastatin (LIPITOR) 40 MG tablet Take 40 mg by mouth daily.    Marland Kitchen azelastine (ASTELIN) 0.1 % nasal spray Use in each nostril as directed    . buPROPion (WELLBUTRIN XL) 300 MG 24 hr tablet Take 1 tablet by mouth daily.    . citalopram (CELEXA) 10 MG tablet Take 10 mg by mouth daily.    . clopidogrel (PLAVIX) 75 MG tablet Take 75 mg by mouth daily.    Marland Kitchen lisinopril (ZESTRIL) 10 MG tablet Take 10 mg by mouth daily.    . methocarbamol (ROBAXIN) 750 MG tablet Take by mouth as needed.    Marland Kitchen OZEMPIC, 0.25 OR 0.5 MG/DOSE, 2 MG/1.5ML SOPN Inject 0.25 mg into the skin once a week.    . Vitamin D, Ergocalciferol, (DRISDOL) 1.25 MG (50000 UNIT) CAPS capsule Take 50,000 Units by mouth 2 (two) times a week.    . hydrOXYzine (ATARAX/VISTARIL) 25 MG tablet Take 25 mg by mouth. (Patient not taking: Reported on 10/07/2020)     No current facility-administered medications for this visit.    Allergies as of 10/07/2020  . (No Known Allergies)    Vitals: BP 124/87 (BP Location: Right Arm, Patient Position: Sitting, Cuff Size: Large)   Pulse 86   Ht 5\' 6"  (1.676 m)   Wt 294 lb (133.4 kg)   BMI 47.45 kg/m  Last Weight:  Wt Readings from Last 1 Encounters:  10/07/20 294 lb (133.4 kg)   Last Height:   Ht Readings from Last 1 Encounters:  10/07/20 5\' 6"  (1.676 m)   Could not visualize the left, right normal Very slight right facial droop corner of the mouth   Physical exam: Exam: Gen: NAD, conversant, well nourised, obese, well groomed                     CV: RRR, no MRG. No Carotid Bruits. No peripheral edema, warm, nontender Eyes: Conjunctivae clear without exudates or hemorrhage  Neuro: Detailed Neurologic Exam  Speech:    Speech is normal; fluent and spontaneous with normal  comprehension.  Cognition:  The patient is oriented to person, place, and time;     recent and remote memory intact;     language fluent;     normal attention, concentration,     fund of knowledge Cranial Nerves:    The pupils are equal, round, and reactive to light. The fundi are normal and spontaneous venous pulsations are present. Visual fields are full to finger confrontation. Extraocular movements are intact. Trigeminal sensation is intact and the muscles of mastication are normal. The face is symmetric. The palate elevates in the midline. Hearing intact. Voice is normal. Shoulder shrug is normal. The tongue has normal motion without fasciculations.   Coordination:    Normal finger to nose and heel to shin. Normal rapid alternating movements.   Gait:    Heel-toe and tandem gait are normal.   Motor Observation:    No asymmetry, no atrophy, and no involuntary movements noted. Tone:    Normal muscle tone.    Posture:    Posture is normal. normal erect    Strength: minimal right sided weakness more proximal arm and leg with some mild right dorsiflexion weakness as well.     Strength is V/V in the upper and lower limbs.      Sensation: intact to LT, numbness tip of the right thumb.      Reflex Exam:  DTR's:    Deep tendon reflexes in the upper and lower extremities are normal bilaterally.   Toes:    The toes are downgoing bilaterally.   Clonus:    Clonus is absent.    Assessment/Plan:  45 y.o. female here as requested by Shellia Cleverly, PA for generalized headaches.  Past medical history hypertension, hypercholesterolemia, morbid obesity, CVA of the left corona radiata tracking through the internal capsule and lentiform nucleus.  Patient's had 3 episodes of transient neurologic symptoms: 1 episode consisting of right facial sensory changes, one episode consisting of right arm and right leg sensory changes, and another episode of right arm and left leg sensory changes.  I think  this would be very unlikely migraine aura since patient does not have a significant history of headaches or anything resembling migraine disorder in the past.  However I am worried about TIAs and I think that she needs further evaluation especially given her prior stroke at the age of 4.  She has had a thorough evaluation for hypercoagulable disorders in the past.  And an EMG nerve conduction study that found no peripheral causes of her sensory changes.Possibility of Recrudescence of old stroke however prior stroke did not involve sensory changes or left leg.   Recent MRI without acute events but I am worried about TIAs and I think that she needs further evaluation especially given her prior cryptogenic stroke at the age of 58:  - She had a bipap and she stopped using it, I highly encouraged her to follow-up with her sleep doctor as untreated sleep apnea can be a cause of cryptogenic strokes.Sleep study: Sleep apnea has a risk of stroke - Heart monitor 30 days for atrial fibrillation(Afib). If the heart monitor is negative then recommend a loop recorder to monitor for afib for 2-3 years - TCD (for patent foramen ovale) - CTA(CT Angiogram) of the head  to look at the blood vessels of the head and neck - continue Plavix   Orders Placed This Encounter  Procedures  . CT ANGIO HEAD W OR WO CONTRAST  . CT ANGIO NECK W OR WO CONTRAST  .  CARDIAC EVENT MONITOR  . VAS US TRANSCRANIAL DOPPLER W BUBBLES   No orders of t[place]oreahe defined types were placed in this encounter.  I had a long d/w patient about her stroke/TIAs, risk for recurrent stroke/TIAs, personally independently reviewed imaging studies and stroke evaluation results and answered questions.Continue Plavix for secondary stroke prevention and maintain strict control of hypertension with blood pressure goal below 130/90, diabetes with hemoglobin A1c goal below 6.5% and lipids with LDL cholesterol goal below 70 mg/dL. I also advised the patient to eat a  healthy diet with plenty of whole grains,lean meats, fruits and vegetables, exercise regularly and maintain ideal body weight .Followup in the future with me in 3 months or call earlier if necessary.  Cc: Shellia Cleverly, PA,  [place], [place], [place]  Naomie Dean, MD  [facility] [address] [place], [place] [address]  Phone [phone] Fax [phone]  I spent over 90 minutes of face-to-face and non-face-to-face time with patient on the  1. Cerebrovascular accident (CVA), unspecified mechanism (HCC)   2. TIA (transient ischemic attack)    diagnosis.  This included previsit chart review, lab review, study review, order entry, electronic health record documentation, patient education on the different diagnostic and therapeutic options, counseling and coordination of care, risks and benefits of management, compliance, or risk factor reduction

## 2020-10-10 ENCOUNTER — Encounter: Payer: Self-pay | Admitting: Neurology

## 2020-10-11 ENCOUNTER — Encounter: Payer: Self-pay | Admitting: *Deleted

## 2020-10-11 ENCOUNTER — Telehealth: Payer: Self-pay | Admitting: Neurology

## 2020-10-11 NOTE — Progress Notes (Signed)
Patient ID: Megan Hale, female   DOB: 1975/12/03, 45 y.o.   MRN: [id number] Patient enrolled for [employer] to ship a 30 day cardiac event monitor to her home. Letter with instructions mailed to patient.

## 2020-10-11 NOTE — Telephone Encounter (Signed)
CT angio head [facility] #[id number] exp 10/11/20-11/10/20  CT angio neck [facility] #[id number] exp 10/11/20 to 11/10/20 sent to GI for scheduling

## 2020-10-12 ENCOUNTER — Telehealth: Payer: Self-pay | Admitting: Neurology

## 2020-10-12 NOTE — Telephone Encounter (Signed)
[employer] medicare no auth per [place] at the [employer] I Kennon Rounds she will reach out to the patient to schedule.

## 2020-10-12 NOTE — Telephone Encounter (Signed)
Patient is scheduled at [facility] for 10/28/20.

## 2020-10-14 ENCOUNTER — Ambulatory Visit
Admission: RE | Admit: 2020-10-14 | Discharge: 2020-10-14 | Disposition: A | Discharge status: Home / Self Care | Payer: Medicare HMO | Source: Ambulatory Visit | Attending: Neurology | Admitting: Neurology

## 2020-10-14 DIAGNOSIS — I639 Cerebral infarction, unspecified: Secondary | ICD-10-CM

## 2020-10-14 DIAGNOSIS — G459 Transient cerebral ischemic attack, unspecified: Secondary | ICD-10-CM

## 2020-10-14 IMAGING — CT CT ANGIO HEAD
2 of 11 series · 4 of 33 positions shown · IV contrast (iopamidol)
Comparison: Brain MRI [DATE] and earlier. Intracranial MRA
[DATE]. head CT [DATE] and earlier.

CLINICAL DATA: 44-year-old female code stroke presentation in
[REDACTED]. Chronic small vessel disease on brain MRI at that time.
Recurrent episodes of extremity and face numbness. Abnormal
intracranial MRA in [UA].

Creatinine was obtained on site at [HOSPITAL] at [REDACTED].
Results: Creatinine 1.0 mg/dL.
EXAM:
CT ANGIOGRAPHY HEAD AND NECK
TECHNIQUE: Multidetector CT imaging of the head and neck was performed using
the standard protocol during bolus administration of intravenous
contrast. Multiplanar CT image reconstructions and MIPs were
obtained to evaluate the vascular anatomy. Carotid stenosis
measurements (when applicable) are obtained utilizing NASCET
criteria, using the distal internal carotid diameter as the
denominator.
CONTRAST:  75mL [UA] IOPAMIDOL ([UA]) INJECTION 76%

[Series 12: brain 3.00 hr40 s3 sag without ibhc · sagittal · non-contrast · 0.37mm/px · 1 of 64 slices shown]
[im 32/64  soft-tissue]
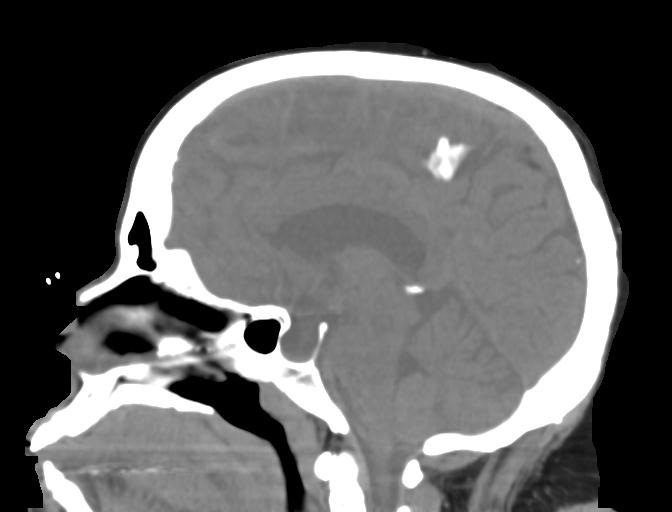

[Series 15: cta head & neck 1.00 hv48 s3 ax thin mips · axial · 0.51mm/px · z∈[-874,-494]mm · 3 of 381 slices shown]
[im 1/381  soft-tissue]
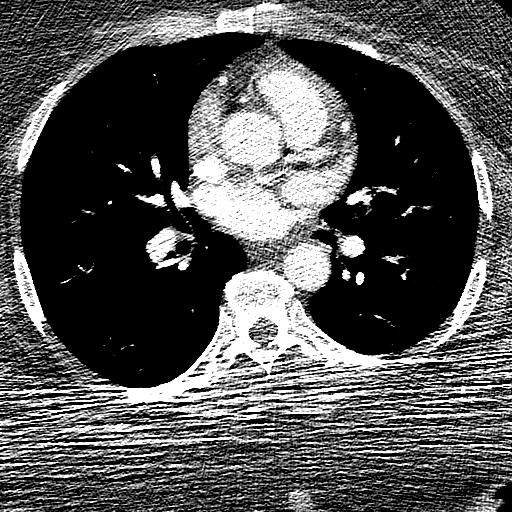
[im 191/381  bone]
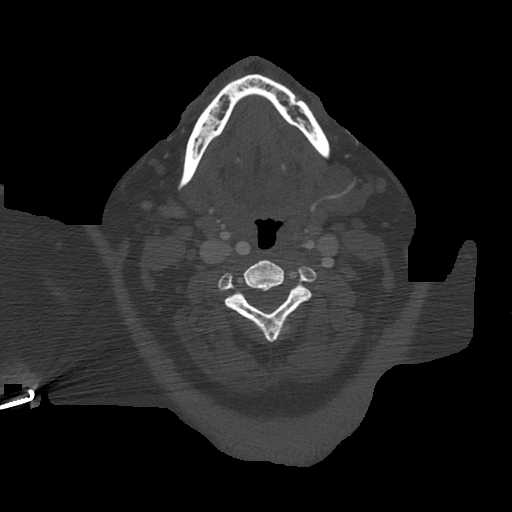
[im 381/381  soft-tissue]
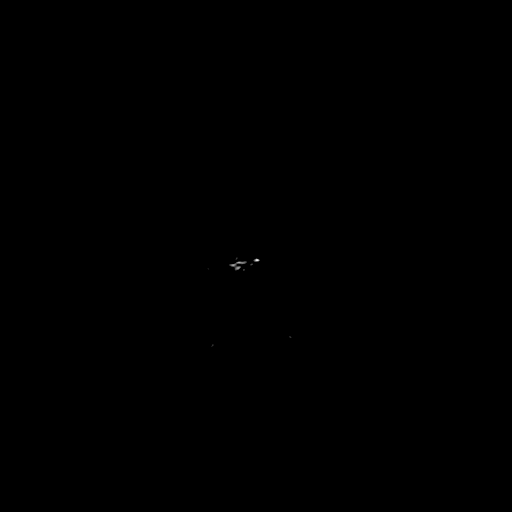

[4 of 33 positions shown; findings below may reference images not displayed]

FINDINGS: CT HEAD

Brain: Chronic lacunar infarct left corona radiata and left
lentiform redemonstrated. Gray-white matter differentiation appears
stable from the JANELLE MRI. Cavum septum pellucidum, normal
variant.

No midline shift, ventriculomegaly, mass effect, evidence of mass
lesion, intracranial hemorrhage or evidence of cortically based
acute infarction. Chronic partially empty sella. Chronic dystrophic
calcifications in the bilateral basal ganglia.

Calvarium and skull base: No acute osseous abnormality identified.

Paranasal sinuses: Visualized paranasal sinuses and mastoids are
stable and well aerated.

Orbits: Visualized orbits and scalp soft tissues are within normal
limits.

CTA NECK

Skeleton: Multilevel cervical spine degeneration appears to be disc
and endplate degeneration plus early ossification of the posterior
longitudinal ligament (OPLL). No acute osseous abnormality
identified.

Upper chest: Large body habitus, otherwise negative.

Other neck: Small disc like candy in the left oral cavity. Otherwise
negative.

Aortic arch: 3 vessel arch configuration with no arch
atherosclerosis.

Right carotid system: Negative aside from a partially
retropharyngeal course and mild tortuosity of the right ICA just
below the skull base.

Left carotid system: Negative aside from mild tortuosity.

Vertebral arteries:
Limited vertebral artery detail due to large body habitus,
subsequent streak artifact and quantum mottle. The left vertebral
artery appears mildly dominant. Both proximal subclavian arteries
appear normal. No vertebral artery stenosis or abnormality is
evident in the neck.

CTA HEAD

Posterior circulation: Mildly dominant left V4 segment, seems more
asymmetric than on the [UA] MRA. And in addition to chronic moderate
to severe vertebrobasilar junction stenosis (greater on the right
series 15, image 141), there appears to be new mild to moderate
stenosis of the right V4 segment on series 15, image 152.

Basilar artery remains patent without stenosis. Mild Ms. Mild mid
basilar enlargement at the level is stable (of the a ICAs series 21,
image 30). SCA and right PCA origins remain patent. The left P1 is
chronically occluded just beyond its origin. And there is moderate
to severe contralateral right P2 stenosis which is also chronic
(series 20, image 24 today).

Anterior circulation: Both ICA siphons are patent, with mild
calcified plaque on the left and no significant stenosis. Small
posterior communicating arteries appear normal. Patent carotid
termini.

Bilateral MCA and left ACA origins remain normal. Moderate to severe
stenosis at the right ACA origin is chronic and stable (series 21,
image 27). Anterior communicating artery and median artery of the
corpus callosum are within normal limits. Bilateral ACA branches
appear within normal limits today. MCA M1 segments remain patent
without stenosis. MCA bifurcations are patent. Mild ectasia of the
left MCA inferior M2 division is stable (series 21, image 27). Other
visible bilateral MCA branches appears stable.

Venous sinuses: Patent but effaced appearance of the bilateral
transverse and sigmoid sinus junctions.

Anatomic variants: None.

Review of the MIP images confirms the above findings
IMPRESSION: 1. CTA Neck is negative, allowing for suboptimal visualization of
the cervical vertebral arteries.
2. CTA head demonstrates chronic intracranial atherosclerosis and
stenoses:
- chronic occlusion of the Left PCA just beyond its origin.
- chronic moderate to severe stenosis at the Vertebrobasilar
junction.
- increased stenosis of the Right V4 segment since [UA], moderate.
- chronic moderate to severe stenosis of the Right PCA P2.
- chronic moderate to severe stenosis of the Right ACA origin.
3. Mild fusiform enlargement of a Left MCA M2 branch is stable, and
appears inconsequential at this time.
4.  Stable non contrast CT appearance of the brain:
- left hemisphere chronic small vessel disease.
- chronic partially empty sella and effaced appearance of the
transverse and sigmoid venous sinus junctions which might indicate
chronic idiopathic intracranial hypertension (pseudotumor cerebri)
5. Chronic cervical spine degeneration with evidence of some
ossification of the posterior longitudinal ligament (OPLL).

## 2020-10-14 MED ORDER — IOPAMIDOL (ISOVUE-300) INJECTION 61%: 100.0000 mL | Freq: Once | INTRAVENOUS | Status: DC | PRN

## 2020-10-14 MED ORDER — IOPAMIDOL (ISOVUE-370) INJECTION 76%
75.0000 mL | Freq: Once | INTRAVENOUS | Status: AC | PRN
  Administered 2020-10-14: 75 mL via INTRAVENOUS

## 2020-10-18 ENCOUNTER — Telehealth: Payer: Self-pay | Admitting: *Deleted

## 2020-10-18 NOTE — Telephone Encounter (Signed)
Spoke with patient and discussed results.  She verbalized understanding and she asked if there is finding specifically would have anything to do with the numbness that she had felt which she was seen for.  I let her know message will be sent to Dr. Lucia Gaskins and she verbalized appreciation.

## 2020-10-18 NOTE — Telephone Encounter (Signed)
-----   Message from Anson Fret, MD sent at 10/18/2020 10:21 AM EDT ----- THE CTA of the head and neck show atherosclerosis in the blood vessels. Nothing appears acute and most are stable. No intervention needed at this time but the best way to stop progression is to manage all vascular risk factors (HTN, HLD, Obesity, Diabetes, Sleep apnea, Smoking etc). thanks

## 2020-10-20 NOTE — Telephone Encounter (Signed)
Nothing that would cause her symptoms. However if the atherosclerosis continues to worsen this could put her at risk for more strokes so she has to manage her vascular risk factors very tightly to stop progression. thanks

## 2020-10-21 NOTE — Telephone Encounter (Signed)
Spoke with patient and discussed the latest message from Dr. Daisy Blossom to answer the patient's questions about her MRI results.  She verbalized understanding and appreciation.  Her other questions were answered.  She will proceed with the other testing as planned.

## 2020-10-25 ENCOUNTER — Telehealth: Payer: Self-pay | Admitting: Neurology

## 2020-10-25 ENCOUNTER — Other Ambulatory Visit: Payer: Self-pay | Admitting: Neurology

## 2020-10-25 DIAGNOSIS — G4733 Obstructive sleep apnea (adult) (pediatric): Secondary | ICD-10-CM

## 2020-10-25 NOTE — Telephone Encounter (Signed)
Spoke with patient.  She stated her primary care was going to have to refer her again to another facility in [facility] but she is unable to see a doctor she saw years ago and now wants to go to our facility.  Her last sleep study was in 2017 before her bariatric surgery.  I let the patient know I would ask Dr. Lucia Gaskins if she would be willing to refer.

## 2020-10-25 NOTE — Telephone Encounter (Signed)
Spoke with patient and advised Dr Lucia Gaskins ordered sleep referral for [facility]. Pt aware the sleep team will review referral and call her to schedule the consult. Pt verbalized appreciation.

## 2020-10-25 NOTE — Telephone Encounter (Signed)
Pt would like to know if Dr Lucia Gaskins will put in a referral for her to have a sleep study done here at [facility]

## 2020-10-25 NOTE — Telephone Encounter (Signed)
OSA order placed to [facility]

## 2020-10-28 ENCOUNTER — Other Ambulatory Visit (HOSPITAL_COMMUNITY): Payer: Self-pay | Admitting: Neurology

## 2020-10-28 ENCOUNTER — Ambulatory Visit (HOSPITAL_BASED_OUTPATIENT_CLINIC_OR_DEPARTMENT_OTHER)
Admission: RE | Admit: 2020-10-28 | Discharge: 2020-10-28 | Disposition: A | Discharge status: Home / Self Care | Payer: Medicare HMO | Source: Ambulatory Visit | Attending: Neurology | Admitting: Neurology

## 2020-10-28 ENCOUNTER — Other Ambulatory Visit: Payer: Self-pay

## 2020-10-28 ENCOUNTER — Ambulatory Visit (HOSPITAL_COMMUNITY)
Admission: RE | Admit: 2020-10-28 | Discharge: 2020-10-28 | Disposition: A | Discharge status: Home / Self Care | Payer: Medicare HMO | Source: Ambulatory Visit | Attending: Neurology | Admitting: Neurology

## 2020-10-28 DIAGNOSIS — G459 Transient cerebral ischemic attack, unspecified: Secondary | ICD-10-CM | POA: Diagnosis present

## 2020-10-28 DIAGNOSIS — Q2112 Patent foramen ovale: Secondary | ICD-10-CM

## 2020-10-28 DIAGNOSIS — I639 Cerebral infarction, unspecified: Secondary | ICD-10-CM | POA: Insufficient documentation

## 2020-10-28 DIAGNOSIS — Q211 Atrial septal defect: Secondary | ICD-10-CM

## 2020-10-28 NOTE — Progress Notes (Signed)
VASCULAR LAB    Bilateral lower extremity venous duplex has been performed.  See CV proc for preliminary results.   Ngozi Alvidrez, RVT 10/28/2020, 2:50 PM

## 2020-10-28 NOTE — Progress Notes (Signed)
Kindly inform the patient that lower extremity venous Doppler study showed no evidence of DVT

## 2020-10-28 NOTE — Progress Notes (Signed)
VASCULAR LAB    TCD with bubbles has been performed.  See CV proc for preliminary results.   Nefertiti Mohamad, RVT 10/28/2020, 2:50 PM

## 2020-10-29 ENCOUNTER — Ambulatory Visit (INDEPENDENT_AMBULATORY_CARE_PROVIDER_SITE_OTHER): Payer: Medicare HMO

## 2020-10-29 DIAGNOSIS — G459 Transient cerebral ischemic attack, unspecified: Secondary | ICD-10-CM

## 2020-10-29 DIAGNOSIS — I639 Cerebral infarction, unspecified: Secondary | ICD-10-CM | POA: Diagnosis not present

## 2020-11-02 ENCOUNTER — Telehealth: Payer: Self-pay | Admitting: Neurology

## 2020-11-02 NOTE — Telephone Encounter (Signed)
Anson Fret, MD  10/29/2020  3:39 PM EDT Back to Top     Looks like she may have a small patent foramen ovale. I would like to send her to cardiology for further evaluation. If she is ok with that, let me know and I can send a referral thanks

## 2020-11-02 NOTE — Telephone Encounter (Signed)
Spoke with patient and discussed the results of the TCDs. Patient became tearful and stated Dr Lucia Gaskins said she would talk to her if she has any questions. She would like for Dr Lucia Gaskins to call her back. She verbalized appreciation for the call.

## 2020-11-02 NOTE — Telephone Encounter (Signed)
Pt called to discuss test and results have taken so far. Would like a call from the nurse to discuss my concerns.

## 2020-11-04 ENCOUNTER — Telehealth: Payer: Self-pay | Admitting: Neurology

## 2020-11-04 ENCOUNTER — Telehealth: Payer: Self-pay | Admitting: *Deleted

## 2020-11-04 NOTE — Telephone Encounter (Signed)
I called pt no answer, I left voicemail.

## 2020-11-04 NOTE — Telephone Encounter (Signed)
Great - thanks

## 2020-11-04 NOTE — Telephone Encounter (Signed)
Pt initially called asking for Medical Records for her records to be sent back to her original Neurologist.  Pt called back saying she received the voicemail for Medical Records and is now asking for a call from [facility] so that she can make the request for her records thru the medical record department. Phone rep did try to explain to pt that messages are responded to within 24-48 hours but pt then ended the call, please call per pt request.

## 2020-11-08 ENCOUNTER — Telehealth: Payer: Self-pay | Admitting: Emergency Medicine

## 2020-11-08 NOTE — Telephone Encounter (Signed)
-----   Message from Micki Riley, MD sent at 10/28/2020  2:53 PM EDT ----- Joneen Roach inform the patient that lower extremity venous Doppler study showed no evidence of DVT

## 2020-11-08 NOTE — Telephone Encounter (Signed)
Called and spoke to patient regarding Dr. Marlis Edelson review and findings of Doppler Study.  Patient denied further questions, verbalized understanding and expressed appreciation for the phone call.

## 2020-11-09 ENCOUNTER — Telehealth: Payer: Self-pay

## 2020-11-09 ENCOUNTER — Other Ambulatory Visit: Payer: Self-pay | Admitting: Neurology

## 2020-11-09 DIAGNOSIS — I639 Cerebral infarction, unspecified: Secondary | ICD-10-CM

## 2020-11-09 DIAGNOSIS — Q2112 Patent foramen ovale: Secondary | ICD-10-CM

## 2020-11-09 DIAGNOSIS — Q211 Atrial septal defect: Secondary | ICD-10-CM

## 2020-11-09 NOTE — Telephone Encounter (Signed)
Referral to cardiology sent to [facility] at [facility]. P: [phone].

## 2020-11-17 ENCOUNTER — Telehealth: Payer: Self-pay | Admitting: *Deleted

## 2020-11-17 NOTE — Telephone Encounter (Signed)
I called the patient and LVM (ok per DPR) advising the patient's cardiac monitor was normal which is great news.  Normal sinus rhythm and no arrhythmias.  Patient was advised that Dr. Lucia Gaskins still placed the referral to cardiology for the PFO.  Left office number in message for patient to call if she has any questions.

## 2020-11-17 NOTE — Telephone Encounter (Signed)
-----   Message from Anson Fret, MD sent at 11/16/2020  5:07 PM EDT ----- Please let patient know that the cardiac monitor was normal, great news! I still placed referral to cardiology for the pfo.

## 2020-12-01 ENCOUNTER — Telehealth: Payer: Self-pay

## 2020-12-01 NOTE — Telephone Encounter (Signed)
Per Dr. Lucia Gaskins, called to arrange PFO consult with Dr. Excell Seltzer.  Left message to call back.

## 2020-12-02 NOTE — Telephone Encounter (Signed)
Scheduled the patient for PFO consult with Dr. Excell Seltzer on 01/03/2021. She was grateful for call and agrees with plan.

## 2020-12-27 ENCOUNTER — Ambulatory Visit (INDEPENDENT_AMBULATORY_CARE_PROVIDER_SITE_OTHER): Payer: Medicare HMO | Admitting: Neurology

## 2020-12-27 ENCOUNTER — Other Ambulatory Visit: Payer: Self-pay

## 2020-12-27 ENCOUNTER — Encounter: Payer: Self-pay | Admitting: Neurology

## 2020-12-27 VITALS — BP 121/84 | HR 63 | Ht 66.5 in | Wt 298.5 lb

## 2020-12-27 DIAGNOSIS — G4733 Obstructive sleep apnea (adult) (pediatric): Secondary | ICD-10-CM

## 2020-12-27 NOTE — Progress Notes (Signed)
No charge , co-pay returned.

## 2020-12-27 NOTE — Patient Instructions (Signed)
NO CHARGE - please return Co-pay.

## 2021-01-03 ENCOUNTER — Encounter: Payer: Self-pay | Admitting: Cardiovascular Disease

## 2021-01-03 ENCOUNTER — Other Ambulatory Visit: Payer: Self-pay

## 2021-01-03 ENCOUNTER — Ambulatory Visit: Payer: Medicare HMO | Admitting: Cardiovascular Disease

## 2021-01-03 VITALS — BP 138/84 | HR 87 | Ht 66.5 in | Wt 305.2 lb

## 2021-01-03 DIAGNOSIS — Q2112 Patent foramen ovale: Secondary | ICD-10-CM

## 2021-01-03 DIAGNOSIS — I10 Essential (primary) hypertension: Secondary | ICD-10-CM

## 2021-01-03 DIAGNOSIS — Q211 Atrial septal defect: Secondary | ICD-10-CM | POA: Diagnosis not present

## 2021-01-03 NOTE — Patient Instructions (Signed)
Medication Instructions:  Your physician recommends that you continue on your current medications as directed. Please refer to the Current Medication list given to you today.  *If you need a refill on your cardiac medications before your next appointment, please call your pharmacy*   Lab Work: BMET and CBC today  If you have labs (blood work) drawn today and your tests are completely normal, you will receive your results only by: MyChart Message (if you have MyChart) OR A paper copy in the mail If you have any lab test that is abnormal or we need to change your treatment, we will call you to review the results.   Testing/Procedures: Your physician has requested that you have a TEE. During a TEE, sound waves are used to create images of your heart. It provides your doctor with information about the size and shape of your heart and how well your heart's chambers and valves are working. In this test, a transducer is attached to the end of a flexible tube that's guided down your throat and into your esophagus (the tube leading from you mouth to your stomach) to get a more detailed image of your heart. You are not awake for the procedure. Please see the instruction sheet given to you today. For further information please visit [email or web address]   Follow-Up:  Follow up will be based on outcome of TEE   Other Instructions  Dear Ms. Sochacki, You are scheduled for a TEE on Wednesday, January 12, 2021 with Dr. Anne Fu.  Please arrive at the [facility] (Main Entrance A) at [facility]: [address] [place], [place] [address] at 9:30 am.   DIET: Nothing to eat or drink after midnight except a sip of water with medications (see medication instructions below)  FYI: For your safety, and to allow [place] to monitor your vital signs accurately during the surgery/procedure we request that   if you have artificial nails, gel coating, SNS etc. Please have those removed prior to your  surgery/procedure. Not having the nail coverings /polish removed may result in cancellation or delay of your surgery/procedure.   Medication Instructions:  You may take your medications as prescribed.    Labs: You had your labs drawn today.  You must have a responsible person to drive you home and stay in the waiting area during your procedure. Failure to do so could result in cancellation.  Bring your insurance cards.  *Special Note: Every effort is made to have your procedure done on time. Occasionally there are emergencies that occur at the hospital that may cause delays. Please be patient if a delay does occur.

## 2021-01-03 NOTE — Progress Notes (Signed)
Cardiology Office Note:    Date:  01/03/2021   ID:  Megan Hale, DOB 08/01/1975, MRN [id number]  PCP:  Shellia Cleverly, PA   [facility] Providers Cardiologist:  None     Referring MD: Anson Fret, MD   Chief Complaint  Patient presents with   PFO    History of Present Illness:    Megan Hale is a 45 y.o. female referred for evaluation of PFO by Dr. Lucia Gaskins.  The patient's medical history includes hypertension, morbid obesity, and mixed hyperlipidemia.  She suffered a stroke approximately 10 years ago affecting her right side.  At the time of her stroke, she weighed greater than 400 pounds.  She ultimately underwent bariatric sleeve surgery in 2017 and achieved greater than 100 pounds of weight loss.  She has had some episodes of right facial numbness and was evaluated in September 2021 for numbness in her right arm and right leg at which time her imaging studies were negative for acute stroke.  She has no history of migraine headaches.  She denies chest pain or pressure.  She denies heart palpitations.  She has been on clopidogrel since last year, previously has taken aspirin.  There is an echocardiogram reported from September 2021 that showed normal LV systolic function, no significant valvular disease, and negative bubble study.  Carotid Doppler has shown no carotid atherosclerosis.  She did undergo a transcranial Doppler bubble study which was positive for right-to-left shunting only with Valsalva (Spencer grade 1 shunt).  The patient is here with her mother today.  She reports no recent stroke or TIA symptoms.  She denies chest pain, heart palpitations, shortness of breath, orthopnea, PND, or leg swelling.  Past Medical History:  Diagnosis Date   Anxiety    CVA (cerebral vascular accident) (HCC)    Acute left Basal Ganglia - right hemiplagia - Dr Johnell Comings   Depression    Hyperlipidemia    Hypertension    Migraine    Morbid obesity (HCC)    OSA (obstructive sleep apnea)     Bi-Pap   Vitamin D deficiency     Past Surgical History:  Procedure Laterality Date   LAPAROSCOPIC GASTRIC SLEEVE RESECTION      Current Medications: Current Meds  Medication Sig   atorvastatin (LIPITOR) 40 MG tablet Take 40 mg by mouth daily.   azelastine (ASTELIN) 0.1 % nasal spray Use in each nostril as directed   buPROPion (WELLBUTRIN XL) 300 MG 24 hr tablet Take 1 tablet by mouth daily.   citalopram (CELEXA) 10 MG tablet Take 10 mg by mouth daily.   clopidogrel (PLAVIX) 75 MG tablet Take 75 mg by mouth daily.   hydrOXYzine (ATARAX/VISTARIL) 25 MG tablet Take 25 mg by mouth.   lisinopril (ZESTRIL) 10 MG tablet Take 10 mg by mouth daily.   methocarbamol (ROBAXIN) 750 MG tablet Take by mouth as needed.   OZEMPIC, 0.25 OR 0.5 MG/DOSE, 2 MG/1.5ML SOPN Inject 0.25 mg into the skin once a week.   Vitamin D, Ergocalciferol, (DRISDOL) 1.25 MG (50000 UNIT) CAPS capsule Take 50,000 Units by mouth 2 (two) times a week.     Allergies:   Patient has no known allergies.   Social History   Socioeconomic History   Marital status: Single    Spouse name: Not on file   Number of children: 0   Years of education: 16   Highest education level: Not on file  Occupational History   Occupation: CUSTOMER SERVICE  Employer: [employer]    Comment: LONG TERM DISABILITY   Tobacco Use   Smoking status: Never   Smokeless tobacco: Never  Vaping Use   Vaping Use: Never used  Substance and Sexual Activity   Alcohol use: Not Currently    Comment: rare   Drug use: No   Sexual activity: Not on file  Other Topics Concern   Not on file  Social History Narrative   Marital Status:  Single   Children:  None (She would like to adopt a little girl [blonde hair/blue eyes]).   Pets:   None    Living Situation: Her mother lived with her for 1.5 yrs after patient had her CVA.  Her mom moved back to her own home.     Occupation: She is currently out on LTD with ([employer]). She previously  worked in Clinical biochemistcustomer service. She is trying to arrange it so that she can work from home.     Education: Engineer, maintenance (IT)College Graduate (BA - History) [employer]    Tobacco Use/Exposure:  None    Alcohol Use:  Occasional    Drug Use:  None   Diet:  Regular   Exercise:  Limited    Hobbies:  Poetry, Museums       No caffeine   Left handed now since stroke       Social Determinants of Health   Financial Resource Strain: Not on file  Food Insecurity: Not on file  Transportation Needs: Not on file  Physical Activity: Not on file  Stress: Not on file  Social Connections: Not on file     Family History: The patient's family history includes CVA in her brother; CVA (age of onset: 4237) in her father; Diabetes in her brother, brother, mother, and sister; Heart attack in her brother; Heart disease in her mother; Hypertension in her father; Stroke in her mother and paternal grandfather.  ROS:   Please see the history of present illness.    All other systems reviewed and are negative.  EKGs/Labs/Other Studies Reviewed:    The following studies were reviewed today: Event Monitor: Study Highlights  Normal sinus rhythm with average heart rate 85bpm and ranged from 52 to 151 bpm. No arrhythmias   Brain MRI: IMPRESSION: 1. No acute intracranial abnormality. Chronic small vessel disease which is advanced for age but stable since last year, including involvement of the left side deep white matter and also the right pons.   2. Cervical spine degeneration also appears advanced for age, with at least mild degenerative spinal cord mass effect at C4-C5.   Transcranial Doppler: Summary:       No HITS (High Intensity Transient Signals) heard at rest. Less than 10  HITS heard with Valsalva, indicating a Spencer Grade I PFO (patent foramen  ovale).  *See table(s) above for TCD measurements and observations.   CTA Head and Neck: IMPRESSION: 1. CTA Neck is negative, allowing for suboptimal visualization  of the cervical vertebral arteries. 2. CTA head demonstrates chronic intracranial atherosclerosis and stenoses: - chronic occlusion of the Left PCA just beyond its origin. - chronic moderate to severe stenosis at the Vertebrobasilar junction. - increased stenosis of the Right V4 segment since 2021, moderate. - chronic moderate to severe stenosis of the Right PCA P2. - chronic moderate to severe stenosis of the Right ACA origin. 3. Mild fusiform enlargement of a Left MCA M2 branch is stable, and appears inconsequential at this time. 4.  Stable non contrast CT  appearance of the brain: - left hemisphere chronic small vessel disease. 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  EKG:  EKG is not ordered today.   Recent Labs: 07/11/2020: ALT 13; BUN 19; Creatinine, Ser 1.26; Hemoglobin 11.9; Platelets 337; Potassium 4.3; Sodium 139  Recent Lipid Panel No results found for: CHOL, TRIG, HDL, CHOLHDL, VLDL, LDLCALC, LDLDIRECT   Risk Assessment/Calculations:           Physical Exam:    VS:  BP 138/84   Pulse 87   Ht 5' 6.5" (1.689 m)   Wt (!) 305 lb 3.2 oz (138.4 kg)   SpO2 97%   BMI 48.52 kg/m     Wt Readings from Last 3 Encounters:  01/03/21 (!) 305 lb 3.2 oz (138.4 kg)  12/27/20 298 lb 8 oz (135.4 kg)  10/07/20 294 lb (133.4 kg)     GEN: Pleasant morbidly obese woman in no acute distress HEENT: Normal NECK: No JVD; No carotid bruits LYMPHATICS: No lymphadenopathy CARDIAC: RRR, no murmurs, rubs, gallops RESPIRATORY:  Clear to auscultation without rales, wheezing or rhonchi  ABDOMEN: Soft, non-tender, non-distended MUSCULOSKELETAL:  No edema; No deformity  SKIN: Warm and dry NEUROLOGIC:  Alert and  oriented x 3 PSYCHIATRIC:  Normal affect   ASSESSMENT:    1. PFO (patent foramen ovale)   2. Essential hypertension, benign    PLAN:    In order of problems listed above:  All available data is reviewed.  The patient had a stroke at age 64.  Extensive evaluation showed no evidence of hypercoagulable disorder.  Recent studies have demonstrated no significant carotid atherosclerosis.  There is no atrial fibrillation seen on an event monitor.  The patient does have significant intracranial stenoses and atherosclerotic lesions.  She has been found to have a small PFO based on transcranial Doppler study.  We had extensive discussion today regarding the association between PFO and cryptogenic stroke.  We reviewed available clinical trial data.  This is placed in the context of her known medical comorbidities.  I have recommended a transesophageal echo to further evaluate the presence and size of a PFO.  I have reviewed the risks, indications, and alternatives to transesophageal echo with the patient and her mother who is present today.  They understand and agree to proceed.  If a small PFO is found, I would likely opt for ongoing medical therapy.  However, if the patient has a moderate to large PFO with findings that have been associated with a higher secondary stroke risk, she might be a candidate for transcatheter closure.  I reviewed the transcatheter PFO closure procedure with the patient today.  I demonstrated the Amplatzer PFO occluder device.  We reviewed risks, indications, and alternatives to this procedure.  She understands that she will be contacted after her transesophageal echo is completed and we can determine whether she might be a candidate with anatomic features suitable for transcatheter PFO closure.  It is important to note that she might have a nickel allergy.  She has reacted to jewelry in the past, but it is not completely clear whether she truly has a nickel allergy.  If we proceeded  with PFO closure, I would likely refer to an allergist prior to the closure procedure.   Shared Decision Making/Informed Consent The risks [esophageal  damage, perforation (1:10,000 risk), bleeding, pharyngeal hematoma as well as other potential complications associated with conscious sedation including aspiration, arrhythmia, respiratory failure and death], benefits (treatment guidance and diagnostic support) and alternatives of a transesophageal echocardiogram were discussed in detail with Ms. Mulgrew and she is willing to proceed.     Medication Adjustments/Labs and Tests Ordered: Current medicines are reviewed at length with the patient today.  Concerns regarding medicines are outlined above.  Orders Placed This Encounter  Procedures   Basic metabolic panel   CBC   No orders of the defined types were placed in this encounter.   Patient Instructions  Medication Instructions:  Your physician recommends that you continue on your current medications as directed. Please refer to the Current Medication list given to you today.  *If you need a refill on your cardiac medications before your next appointment, please call your pharmacy*   Lab Work: BMET and CBC today  If you have labs (blood work) drawn today and your tests are completely normal, you will receive your results only by: MyChart Message (if you have MyChart) OR A paper copy in the mail If you have any lab test that is abnormal or we need to change your treatment, we will call you to review the results.   Testing/Procedures: Your physician has requested that you have a TEE. During a TEE, sound waves are used to create images of your heart. It provides your doctor with information about the size and shape of your heart and how well your heart's chambers and valves are working. In this test, a transducer is attached to the end of a flexible tube that's guided down your throat and into your esophagus (the tube leading from you  mouth to your stomach) to get a more detailed image of your heart. You are not awake for the procedure. Please see the instruction sheet given to you today. For further information please visit [email or web address]   Follow-Up:  Follow up will be based on outcome of TEE   Other Instructions  Dear Ms. Mordan, You are scheduled for a TEE on Wednesday, January 12, 2021 with Dr. Anne Fu.  Please arrive at the [facility] (Main Entrance A) at [facility]: [address] [place], [place] [address] at 9:30 am.   DIET: Nothing to eat or drink after midnight except a sip of water with medications (see medication instructions below)  FYI: For your safety, and to allow [place] to monitor your vital signs accurately during the surgery/procedure we request that   if you have artificial nails, gel coating, SNS etc. Please have those removed prior to your surgery/procedure. Not having the nail coverings /polish removed may result in cancellation or delay of your surgery/procedure.   Medication Instructions:  You may take your medications as prescribed.    Labs: You had your labs drawn today.  You must have a responsible person to drive you home and stay in the waiting area during your procedure. Failure to do so could result in cancellation.  Bring your insurance cards.  *Special Note: Every effort is made to have your procedure done on time. Occasionally there are emergencies that occur at the hospital that may cause delays. Please be patient if a delay does occur.      Signed, Tonny Bollman, MD  01/03/2021 5:07 PM    [facility]

## 2021-01-03 NOTE — H&P (View-Only) (Signed)
Cardiology Office Note:    Date:  01/03/2021   ID:  Megan Hale, DOB 08/01/1975, MRN [id number]  PCP:  Shellia Cleverly, PA   [facility] Providers Cardiologist:  None     Referring MD: Anson Fret, MD   Chief Complaint  Patient presents with   PFO    History of Present Illness:    Megan Hale is a 45 y.o. female referred for evaluation of PFO by Dr. Lucia Gaskins.  The patient's medical history includes hypertension, morbid obesity, and mixed hyperlipidemia.  She suffered a stroke approximately 10 years ago affecting her right side.  At the time of her stroke, she weighed greater than 400 pounds.  She ultimately underwent bariatric sleeve surgery in 2017 and achieved greater than 100 pounds of weight loss.  She has had some episodes of right facial numbness and was evaluated in September 2021 for numbness in her right arm and right leg at which time her imaging studies were negative for acute stroke.  She has no history of migraine headaches.  She denies chest pain or pressure.  She denies heart palpitations.  She has been on clopidogrel since last year, previously has taken aspirin.  There is an echocardiogram reported from September 2021 that showed normal LV systolic function, no significant valvular disease, and negative bubble study.  Carotid Doppler has shown no carotid atherosclerosis.  She did undergo a transcranial Doppler bubble study which was positive for right-to-left shunting only with Valsalva (Spencer grade 1 shunt).  The patient is here with her mother today.  She reports no recent stroke or TIA symptoms.  She denies chest pain, heart palpitations, shortness of breath, orthopnea, PND, or leg swelling.  Past Medical History:  Diagnosis Date   Anxiety    CVA (cerebral vascular accident) (HCC)    Acute left Basal Ganglia - right hemiplagia - Dr Johnell Comings   Depression    Hyperlipidemia    Hypertension    Migraine    Morbid obesity (HCC)    OSA (obstructive sleep apnea)     Bi-Pap   Vitamin D deficiency     Past Surgical History:  Procedure Laterality Date   LAPAROSCOPIC GASTRIC SLEEVE RESECTION      Current Medications: Current Meds  Medication Sig   atorvastatin (LIPITOR) 40 MG tablet Take 40 mg by mouth daily.   azelastine (ASTELIN) 0.1 % nasal spray Use in each nostril as directed   buPROPion (WELLBUTRIN XL) 300 MG 24 hr tablet Take 1 tablet by mouth daily.   citalopram (CELEXA) 10 MG tablet Take 10 mg by mouth daily.   clopidogrel (PLAVIX) 75 MG tablet Take 75 mg by mouth daily.   hydrOXYzine (ATARAX/VISTARIL) 25 MG tablet Take 25 mg by mouth.   lisinopril (ZESTRIL) 10 MG tablet Take 10 mg by mouth daily.   methocarbamol (ROBAXIN) 750 MG tablet Take by mouth as needed.   OZEMPIC, 0.25 OR 0.5 MG/DOSE, 2 MG/1.5ML SOPN Inject 0.25 mg into the skin once a week.   Vitamin D, Ergocalciferol, (DRISDOL) 1.25 MG (50000 UNIT) CAPS capsule Take 50,000 Units by mouth 2 (two) times a week.     Allergies:   Patient has no known allergies.   Social History   Socioeconomic History   Marital status: Single    Spouse name: Not on file   Number of children: 0   Years of education: 16   Highest education level: Not on file  Occupational History   Occupation: CUSTOMER SERVICE  Employer: [employer]    Comment: LONG TERM DISABILITY   Tobacco Use   Smoking status: Never   Smokeless tobacco: Never  Vaping Use   Vaping Use: Never used  Substance and Sexual Activity   Alcohol use: Not Currently    Comment: rare   Drug use: No   Sexual activity: Not on file  Other Topics Concern   Not on file  Social History Narrative   Marital Status:  Single   Children:  None (She would like to adopt a little girl [blonde hair/blue eyes]).   Pets:   None    Living Situation: Her mother lived with her for 1.5 yrs after patient had her CVA.  Her mom moved back to her own home.     Occupation: She is currently out on LTD with ([employer]). She previously  worked in customer service. She is trying to arrange it so that she can work from home.     Education: College Graduate (BA - History) [employer]    Tobacco Use/Exposure:  None    Alcohol Use:  Occasional    Drug Use:  None   Diet:  Regular   Exercise:  Limited    Hobbies:  Poetry, Museums       No caffeine   Left handed now since stroke       Social Determinants of Health   Financial Resource Strain: Not on file  Food Insecurity: Not on file  Transportation Needs: Not on file  Physical Activity: Not on file  Stress: Not on file  Social Connections: Not on file     Family History: The patient's family history includes CVA in her brother; CVA (age of onset: 37) in her father; Diabetes in her brother, brother, mother, and sister; Heart attack in her brother; Heart disease in her mother; Hypertension in her father; Stroke in her mother and paternal grandfather.  ROS:   Please see the history of present illness.    All other systems reviewed and are negative.  EKGs/Labs/Other Studies Reviewed:    The following studies were reviewed today: Event Monitor: Study Highlights  Normal sinus rhythm with average heart rate 85bpm and ranged from 52 to 151 bpm. No arrhythmias   Brain MRI: IMPRESSION: 1. No acute intracranial abnormality. Chronic small vessel disease which is advanced for age but stable since last year, including involvement of the left side deep white matter and also the right pons.   2. Cervical spine degeneration also appears advanced for age, with at least mild degenerative spinal cord mass effect at C4-C5.   Transcranial Doppler: Summary:       No HITS (High Intensity Transient Signals) heard at rest. Less than 10  HITS heard with Valsalva, indicating a Spencer Grade I PFO (patent foramen  ovale).  *See table(s) above for TCD measurements and observations.   CTA Head and Neck: IMPRESSION: 1. CTA Neck is negative, allowing for suboptimal visualization  of the cervical vertebral arteries. 2. CTA head demonstrates chronic intracranial atherosclerosis and stenoses: - chronic occlusion of the Left PCA just beyond its origin. - chronic moderate to severe stenosis at the Vertebrobasilar junction. - increased stenosis of the Right V4 segment since 2021, moderate. - chronic moderate to severe stenosis of the Right PCA P2. - chronic moderate to severe stenosis of the Right ACA origin. 3. Mild fusiform enlargement of a Left MCA M2 branch is stable, and appears inconsequential at this time. 4.  Stable non contrast CT   appearance of the brain: - left hemisphere chronic small vessel disease. 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  EKG:  EKG is not ordered today.   Recent Labs: 07/11/2020: ALT 13; BUN 19; Creatinine, Ser 1.26; Hemoglobin 11.9; Platelets 337; Potassium 4.3; Sodium 139  Recent Lipid Panel No results found for: CHOL, TRIG, HDL, CHOLHDL, VLDL, LDLCALC, LDLDIRECT   Risk Assessment/Calculations:           Physical Exam:    VS:  BP 138/84   Pulse 87   Ht 5' 6.5" (1.689 m)   Wt (!) 305 lb 3.2 oz (138.4 kg)   SpO2 97%   BMI 48.52 kg/m     Wt Readings from Last 3 Encounters:  01/03/21 (!) 305 lb 3.2 oz (138.4 kg)  12/27/20 298 lb 8 oz (135.4 kg)  10/07/20 294 lb (133.4 kg)     GEN: Pleasant morbidly obese woman in no acute distress HEENT: Normal NECK: No JVD; No carotid bruits LYMPHATICS: No lymphadenopathy CARDIAC: RRR, no murmurs, rubs, gallops RESPIRATORY:  Clear to auscultation without rales, wheezing or rhonchi  ABDOMEN: Soft, non-tender, non-distended MUSCULOSKELETAL:  No edema; No deformity  SKIN: Warm and dry NEUROLOGIC:  Alert and  oriented x 3 PSYCHIATRIC:  Normal affect   ASSESSMENT:    1. PFO (patent foramen ovale)   2. Essential hypertension, benign    PLAN:    In order of problems listed above:  All available data is reviewed.  The patient had a stroke at age 64.  Extensive evaluation showed no evidence of hypercoagulable disorder.  Recent studies have demonstrated no significant carotid atherosclerosis.  There is no atrial fibrillation seen on an event monitor.  The patient does have significant intracranial stenoses and atherosclerotic lesions.  She has been found to have a small PFO based on transcranial Doppler study.  We had extensive discussion today regarding the association between PFO and cryptogenic stroke.  We reviewed available clinical trial data.  This is placed in the context of her known medical comorbidities.  I have recommended a transesophageal echo to further evaluate the presence and size of a PFO.  I have reviewed the risks, indications, and alternatives to transesophageal echo with the patient and her mother who is present today.  They understand and agree to proceed.  If a small PFO is found, I would likely opt for ongoing medical therapy.  However, if the patient has a moderate to large PFO with findings that have been associated with a higher secondary stroke risk, she might be a candidate for transcatheter closure.  I reviewed the transcatheter PFO closure procedure with the patient today.  I demonstrated the Amplatzer PFO occluder device.  We reviewed risks, indications, and alternatives to this procedure.  She understands that she will be contacted after her transesophageal echo is completed and we can determine whether she might be a candidate with anatomic features suitable for transcatheter PFO closure.  It is important to note that she might have a nickel allergy.  She has reacted to jewelry in the past, but it is not completely clear whether she truly has a nickel allergy.  If we proceeded  with PFO closure, I would likely refer to an allergist prior to the closure procedure.   Shared Decision Making/Informed Consent The risks [esophageal  damage, perforation (1:10,000 risk), bleeding, pharyngeal hematoma as well as other potential complications associated with conscious sedation including aspiration, arrhythmia, respiratory failure and death], benefits (treatment guidance and diagnostic support) and alternatives of a transesophageal echocardiogram were discussed in detail with Ms. Mulgrew and she is willing to proceed.     Medication Adjustments/Labs and Tests Ordered: Current medicines are reviewed at length with the patient today.  Concerns regarding medicines are outlined above.  Orders Placed This Encounter  Procedures   Basic metabolic panel   CBC   No orders of the defined types were placed in this encounter.   Patient Instructions  Medication Instructions:  Your physician recommends that you continue on your current medications as directed. Please refer to the Current Medication list given to you today.  *If you need a refill on your cardiac medications before your next appointment, please call your pharmacy*   Lab Work: BMET and CBC today  If you have labs (blood work) drawn today and your tests are completely normal, you will receive your results only by: MyChart Message (if you have MyChart) OR A paper copy in the mail If you have any lab test that is abnormal or we need to change your treatment, we will call you to review the results.   Testing/Procedures: Your physician has requested that you have a TEE. During a TEE, sound waves are used to create images of your heart. It provides your doctor with information about the size and shape of your heart and how well your heart's chambers and valves are working. In this test, a transducer is attached to the end of a flexible tube that's guided down your throat and into your esophagus (the tube leading from you  mouth to your stomach) to get a more detailed image of your heart. You are not awake for the procedure. Please see the instruction sheet given to you today. For further information please visit [email or web address]   Follow-Up:  Follow up will be based on outcome of TEE   Other Instructions  Dear Ms. Mordan, You are scheduled for a TEE on Wednesday, January 12, 2021 with Dr. Anne Fu.  Please arrive at the [facility] (Main Entrance A) at [facility]: [address] [place], [place] [address] at 9:30 am.   DIET: Nothing to eat or drink after midnight except a sip of water with medications (see medication instructions below)  FYI: For your safety, and to allow [place] to monitor your vital signs accurately during the surgery/procedure we request that   if you have artificial nails, gel coating, SNS etc. Please have those removed prior to your surgery/procedure. Not having the nail coverings /polish removed may result in cancellation or delay of your surgery/procedure.   Medication Instructions:  You may take your medications as prescribed.    Labs: You had your labs drawn today.  You must have a responsible person to drive you home and stay in the waiting area during your procedure. Failure to do so could result in cancellation.  Bring your insurance cards.  *Special Note: Every effort is made to have your procedure done on time. Occasionally there are emergencies that occur at the hospital that may cause delays. Please be patient if a delay does occur.      Signed, Tonny Bollman, MD  01/03/2021 5:07 PM    [facility]

## 2021-01-04 ENCOUNTER — Other Ambulatory Visit: Payer: Self-pay | Admitting: Cardiovascular Disease

## 2021-01-04 DIAGNOSIS — Q211 Atrial septal defect: Secondary | ICD-10-CM

## 2021-01-04 DIAGNOSIS — Q2112 Patent foramen ovale: Secondary | ICD-10-CM

## 2021-01-04 LAB — CBC
Hematocrit: 33.5 % — ABNORMAL LOW (ref 34.0–46.6)
Hemoglobin: 10.8 g/dL — ABNORMAL LOW (ref 11.1–15.9)
MCH: 26.9 pg (ref 26.6–33.0)
MCHC: 32.2 g/dL (ref 31.5–35.7)
MCV: 83 fL (ref 79–97)
Platelets: 317 10*3/uL (ref 150–450)
RBC: 4.02 x10E6/uL (ref 3.77–5.28)
RDW: 14.2 % (ref 11.7–15.4)
WBC: 7.8 10*3/uL (ref 3.4–10.8)

## 2021-01-04 LAB — BASIC METABOLIC PANEL
BUN/Creatinine Ratio: 18 (ref 9–23)
BUN: 14 mg/dL (ref 6–24)
CO2: 25 mmol/L (ref 20–29)
Calcium: 9.1 mg/dL (ref 8.7–10.2)
Chloride: 103 mmol/L (ref 96–106)
Creatinine, Ser: 0.76 mg/dL (ref 0.57–1.00)
Glucose: 78 mg/dL (ref 65–99)
Potassium: 4.7 mmol/L (ref 3.5–5.2)
Sodium: 139 mmol/L (ref 134–144)
eGFR: 97 mL/min/{1.73_m2} (ref >59)

## 2021-01-11 ENCOUNTER — Telehealth: Payer: Self-pay | Admitting: Cardiology

## 2021-01-11 ENCOUNTER — Encounter: Payer: Self-pay | Admitting: Cardiovascular Disease

## 2021-01-11 NOTE — Telephone Encounter (Signed)
Error

## 2021-01-11 NOTE — Telephone Encounter (Signed)
Patient is requesting a call back to go over her procedure instructions. Specifically, she would like to know what time she needs to arrive and exactly where to go. Please advise.

## 2021-01-11 NOTE — Telephone Encounter (Signed)
Pt aware of location and time to be at [facility] for TEE tomorrow .Megan Hale

## 2021-01-12 ENCOUNTER — Ambulatory Visit (HOSPITAL_BASED_OUTPATIENT_CLINIC_OR_DEPARTMENT_OTHER)
Admission: RE | Admit: 2021-01-12 | Discharge: 2021-01-12 | Disposition: A | Discharge status: Home / Self Care | Payer: Medicare HMO | Source: Home / Self Care | Attending: Cardiovascular Disease | Admitting: Cardiovascular Disease

## 2021-01-12 ENCOUNTER — Encounter (HOSPITAL_COMMUNITY): Payer: Self-pay | Admitting: Cardiology

## 2021-01-12 ENCOUNTER — Ambulatory Visit (HOSPITAL_COMMUNITY): Payer: Medicare HMO | Admitting: Anesthesiology

## 2021-01-12 ENCOUNTER — Ambulatory Visit (HOSPITAL_COMMUNITY)
Admission: RE | Admit: 2021-01-12 | Discharge: 2021-01-12 | Disposition: A | Discharge status: Home / Self Care | Payer: Medicare HMO | Attending: Cardiology | Admitting: Cardiology

## 2021-01-12 ENCOUNTER — Encounter (HOSPITAL_COMMUNITY)
Admission: RE | Disposition: A | Discharge status: Home / Self Care | Payer: Self-pay | Source: Home / Self Care | Attending: Cardiology

## 2021-01-12 ENCOUNTER — Other Ambulatory Visit: Payer: Self-pay

## 2021-01-12 DIAGNOSIS — Z8673 Personal history of transient ischemic attack (TIA), and cerebral infarction without residual deficits: Secondary | ICD-10-CM | POA: Insufficient documentation

## 2021-01-12 DIAGNOSIS — E782 Mixed hyperlipidemia: Secondary | ICD-10-CM | POA: Diagnosis not present

## 2021-01-12 DIAGNOSIS — Q2112 Patent foramen ovale: Secondary | ICD-10-CM

## 2021-01-12 DIAGNOSIS — Z6841 Body Mass Index (BMI) 40.0 and over, adult: Secondary | ICD-10-CM | POA: Diagnosis not present

## 2021-01-12 DIAGNOSIS — Z7902 Long term (current) use of antithrombotics/antiplatelets: Secondary | ICD-10-CM | POA: Insufficient documentation

## 2021-01-12 DIAGNOSIS — Z79899 Other long term (current) drug therapy: Secondary | ICD-10-CM | POA: Diagnosis not present

## 2021-01-12 DIAGNOSIS — Q211 Atrial septal defect: Secondary | ICD-10-CM | POA: Insufficient documentation

## 2021-01-12 DIAGNOSIS — I10 Essential (primary) hypertension: Secondary | ICD-10-CM | POA: Diagnosis not present

## 2021-01-12 HISTORY — PX: BUBBLE STUDY: SHX6837

## 2021-01-12 HISTORY — PX: TEE WITHOUT CARDIOVERSION: SHX5443

## 2021-01-12 SURGERY — ECHOCARDIOGRAM, TRANSESOPHAGEAL
Anesthesia: Monitor Anesthesia Care

## 2021-01-12 MED ORDER — PROPOFOL 10 MG/ML IV BOLUS
INTRAVENOUS | Status: DC | PRN
  Administered 2021-01-12: 20 mg via INTRAVENOUS

## 2021-01-12 MED ORDER — LIDOCAINE 2% (20 MG/ML) 5 ML SYRINGE
INTRAMUSCULAR | Status: DC | PRN
  Administered 2021-01-12: 60 mg via INTRAVENOUS

## 2021-01-12 MED ORDER — PROPOFOL 500 MG/50ML IV EMUL
INTRAVENOUS | Status: DC | PRN
  Administered 2021-01-12: 100 ug/kg/min via INTRAVENOUS

## 2021-01-12 MED ORDER — DEXMEDETOMIDINE HCL IN NACL 200 MCG/50ML IV SOLN
INTRAVENOUS | Status: DC | PRN
  Administered 2021-01-12: 4 ug via INTRAVENOUS
  Administered 2021-01-12: 8 ug via INTRAVENOUS

## 2021-01-12 MED ORDER — SODIUM CHLORIDE 0.9 % IV SOLN: INTRAVENOUS | Status: DC

## 2021-01-12 NOTE — Interval H&P Note (Signed)
History and Physical Interval Note:  01/12/2021 9:48 AM  Megan Hale  has presented today for surgery, with the diagnosis of PFO.  The various methods of treatment have been discussed with the patient and family. After consideration of risks, benefits and other options for treatment, the patient has consented to  Procedure(s): TRANSESOPHAGEAL ECHOCARDIOGRAM (TEE) (N/A) as a surgical intervention.  The patient's history has been reviewed, patient examined, no change in status, stable for surgery.  I have reviewed the patient's chart and labs.  Questions were answered to the patient's satisfaction.     [employer]

## 2021-01-12 NOTE — Progress Notes (Signed)
  Echocardiogram 2D Echocardiogram has been performed.  Megan Hale 01/12/2021, 10:41 AM

## 2021-01-12 NOTE — Anesthesia Preprocedure Evaluation (Addendum)
Anesthesia Evaluation  Patient identified by MRN, date of birth, ID band Patient awake    Reviewed: Allergy & Precautions, H&P , NPO status , Patient's Chart, lab work & pertinent test results  Airway Mallampati: II  TM Distance: >3 FB Neck ROM: Full    Dental no notable dental hx. (+) Teeth Intact, Dental Advisory Given   Pulmonary sleep apnea ,    Pulmonary exam normal breath sounds clear to auscultation       Cardiovascular hypertension, Pt. on medications  Rhythm:Regular Rate:Normal     Neuro/Psych  Headaches, Anxiety Depression CVA    GI/Hepatic negative GI ROS, Neg liver ROS,   Endo/Other  diabetesMorbid obesity  Renal/GU negative Renal ROS  negative genitourinary   Musculoskeletal   Abdominal   Peds  Hematology negative hematology ROS (+)   Anesthesia Other Findings   Reproductive/Obstetrics negative OB ROS                            Anesthesia Physical Anesthesia Plan  ASA: 3  Anesthesia Plan: MAC   Post-op Pain Management:    Induction: Intravenous  PONV Risk Score and Plan: 2 and Propofol infusion and Treatment may vary due to age or medical condition  Airway Management Planned: Nasal Cannula  Additional Equipment:   Intra-op Plan:   Post-operative Plan:   Informed Consent: I have reviewed the patients History and Physical, chart, labs and discussed the procedure including the risks, benefits and alternatives for the proposed anesthesia with the patient or authorized representative who has indicated his/her understanding and acceptance.     Dental advisory given  Plan Discussed with: CRNA  Anesthesia Plan Comments:         Anesthesia Quick Evaluation

## 2021-01-12 NOTE — CV Procedure (Signed)
   Transesophageal Echocardiogram  Indications: PFO, prior TIA  Time out performed  Propofol administered by anesthesia supervision  Findings:  Left Ventricle: Normal ejection fraction 55 to 60%  Mitral Valve: Normal mitral valve trivial regurgitation  Aortic Valve: Normal aortic valve no regurgitation or stenosis  Tricuspid Valve: Normal tricuspid valve no significant regurgitation  Left Atrium: Normal left atrial appendage no thrombus  Right atrium: Normal  Interatrial septum: Very subtle Doppler signal evidence of PFO.  No apparent anatomic visualization of PFO present.  Bubble Contrast Study: Bubble contrast study was mildly positive with very few bubble crossover during Valsalva/cough only.  This is consistent with a very small PFO.  IMPRESSION: Very small PFO  Donato Schultz, MD

## 2021-01-12 NOTE — Anesthesia Postprocedure Evaluation (Signed)
Anesthesia Post Note  Patient: Kynslee Baham  Procedure(s) Performed: TRANSESOPHAGEAL ECHOCARDIOGRAM (TEE) BUBBLE STUDY     Patient location during evaluation: Endoscopy Anesthesia Type: MAC Level of consciousness: awake and alert Pain management: pain level controlled Vital Signs Assessment: post-procedure vital signs reviewed and stable Respiratory status: spontaneous breathing, nonlabored ventilation and respiratory function stable Cardiovascular status: stable and blood pressure returned to baseline Postop Assessment: no apparent nausea or vomiting Anesthetic complications: no   No notable events documented.  Last Vitals:  Vitals:   01/12/21 1030 01/12/21 1050  BP: 112/66 119/76  Pulse:    Temp: 36.4 C   SpO2:      Last Pain:  Vitals:   01/12/21 1050  TempSrc:   PainSc: 0-No pain                 Callan Yontz,W. EDMOND

## 2021-01-12 NOTE — Transfer of Care (Signed)
Immediate Anesthesia Transfer of Care Note  Patient: Megan Hale  Procedure(s) Performed: TRANSESOPHAGEAL ECHOCARDIOGRAM (TEE) BUBBLE STUDY  Patient Location: Endoscopy Unit  Anesthesia Type:MAC  Level of Consciousness: awake, alert  and drowsy  Airway & Oxygen Therapy: Patient Spontanous Breathing  Post-op Assessment: Report given to RN, Post -op Vital signs reviewed and stable and Patient moving all extremities  Post vital signs: Reviewed and stable  Last Vitals:  Vitals Value Taken Time  BP 112/66 01/12/21 1030  Temp    Pulse 77 01/12/21 1031  Resp 12 01/12/21 1031  SpO2 99 % 01/12/21 1031  Vitals shown include unvalidated device data.  Last Pain:  Vitals:   01/12/21 1003  TempSrc: Oral  PainSc: 0-No pain         Complications: No notable events documented.

## 2021-01-12 NOTE — Anesthesia Procedure Notes (Signed)
Procedure Name: General with mask airway Date/Time: 01/12/2021 9:51 AM Performed by: Aundria Rud, CRNA Pre-anesthesia Checklist: Patient identified, Emergency Drugs available and Timeout performed Patient Re-evaluated:Patient Re-evaluated prior to induction Oxygen Delivery Method: Nasal cannula Preoxygenation: Pre-oxygenation with 100% oxygen Induction Type: IV induction Airway Equipment and Method: Bite block Placement Confirmation: positive ETCO2 and CO2 detector Dental Injury: Teeth and Oropharynx as per pre-operative assessment

## 2021-01-13 ENCOUNTER — Encounter (HOSPITAL_COMMUNITY): Payer: Self-pay | Admitting: Cardiology

## 2021-01-17 ENCOUNTER — Ambulatory Visit: Payer: Medicare HMO | Admitting: Neurology

## 2021-01-18 ENCOUNTER — Telehealth: Payer: Self-pay

## 2021-01-18 NOTE — Telephone Encounter (Signed)
Author: Tonny Bollman, MD Service: Cardiology Author Type: Physician  Filed: 01/18/2021  4:05 PM Date of Service: 01/12/2021 11:06 AM Status: Signed  Editor: Tonny Bollman, MD (Physician)  Reviewed images and completely agree with Dr. Anne Fu' assessment.  The patient has a very small PFO without high risk features of atrial septal aneurysm or large shunt.  I think medical therapy is appropriate.  The TEE findings correlate with findings of transcranial Doppler which demonstrated a Spencer grade 1 right to left shunt.  I reviewed the findings with the patient and recommended ongoing medical therapy.  She expressed her understanding and agreed with our plan.   Tonny Bollman 01/18/2021 4:04 PM

## 2021-01-18 NOTE — Progress Notes (Signed)
Reviewed images and completely agree with Dr. Anne Fu' assessment.  The patient has a very small PFO without high risk features of atrial septal aneurysm or large shunt.  I think medical therapy is appropriate.  The TEE findings correlate with findings of transcranial Doppler which demonstrated a Spencer grade 1 right to left shunt.  I reviewed the findings with the patient and recommended ongoing medical therapy.  She expressed her understanding and agreed with our plan.  Tonny Bollman 01/18/2021 4:04 PM

## 2021-05-16 ENCOUNTER — Encounter: Payer: Self-pay | Admitting: Neurology

## 2022-07-24 ENCOUNTER — Emergency Department (HOSPITAL_BASED_OUTPATIENT_CLINIC_OR_DEPARTMENT_OTHER)
Admission: EM | Admit: 2022-07-24 | Discharge: 2022-07-24 | Disposition: A | Discharge status: Home / Self Care | Payer: Medicare HMO | Attending: Emergency Medicine | Admitting: Emergency Medicine

## 2022-07-24 ENCOUNTER — Emergency Department (HOSPITAL_BASED_OUTPATIENT_CLINIC_OR_DEPARTMENT_OTHER): Payer: Medicare HMO

## 2022-07-24 ENCOUNTER — Encounter (HOSPITAL_BASED_OUTPATIENT_CLINIC_OR_DEPARTMENT_OTHER): Payer: Self-pay | Admitting: Emergency Medicine

## 2022-07-24 ENCOUNTER — Emergency Department (HOSPITAL_COMMUNITY): Payer: Medicare HMO

## 2022-07-24 ENCOUNTER — Other Ambulatory Visit: Payer: Self-pay

## 2022-07-24 DIAGNOSIS — Z7902 Long term (current) use of antithrombotics/antiplatelets: Secondary | ICD-10-CM | POA: Insufficient documentation

## 2022-07-24 DIAGNOSIS — I63532 Cerebral infarction due to unspecified occlusion or stenosis of left posterior cerebral artery: Secondary | ICD-10-CM | POA: Diagnosis not present

## 2022-07-24 DIAGNOSIS — Z20822 Contact with and (suspected) exposure to covid-19: Secondary | ICD-10-CM | POA: Insufficient documentation

## 2022-07-24 DIAGNOSIS — I10 Essential (primary) hypertension: Secondary | ICD-10-CM | POA: Insufficient documentation

## 2022-07-24 DIAGNOSIS — G43101 Migraine with aura, not intractable, with status migrainosus: Secondary | ICD-10-CM

## 2022-07-24 DIAGNOSIS — Z79899 Other long term (current) drug therapy: Secondary | ICD-10-CM | POA: Diagnosis not present

## 2022-07-24 DIAGNOSIS — R202 Paresthesia of skin: Secondary | ICD-10-CM

## 2022-07-24 DIAGNOSIS — R200 Anesthesia of skin: Secondary | ICD-10-CM | POA: Diagnosis present

## 2022-07-24 LAB — RESP PANEL BY RT-PCR (RSV, FLU A&B, COVID)  RVPGX2
Influenza A by PCR: NEGATIVE
Influenza B by PCR: NEGATIVE
Resp Syncytial Virus by PCR: NEGATIVE
SARS Coronavirus 2 by RT PCR: NEGATIVE

## 2022-07-24 LAB — URINALYSIS, ROUTINE W REFLEX MICROSCOPIC
Bilirubin Urine: NEGATIVE
Glucose, UA: NEGATIVE mg/dL
Hgb urine dipstick: NEGATIVE
Ketones, ur: NEGATIVE mg/dL
Leukocytes,Ua: NEGATIVE
Nitrite: NEGATIVE
Protein, ur: NEGATIVE mg/dL
Specific Gravity, Urine: 1.025 (ref 1.005–1.030)
pH: 6.5 (ref 5.0–8.0)

## 2022-07-24 LAB — COMPREHENSIVE METABOLIC PANEL
ALT: 8 U/L (ref 0–44)
AST: 15 U/L (ref 15–41)
Albumin: 3.8 g/dL (ref 3.5–5.0)
Alkaline Phosphatase: 55 U/L (ref 38–126)
Anion gap: 9 (ref 5–15)
BUN: 14 mg/dL (ref 6–20)
CO2: 27 mmol/L (ref 22–32)
Calcium: 9.1 mg/dL (ref 8.9–10.3)
Chloride: 105 mmol/L (ref 98–111)
Creatinine, Ser: 0.82 mg/dL (ref 0.44–1.00)
GFR, Estimated: >60 mL/min (ref >60)
Glucose, Bld: 82 mg/dL (ref 70–99)
Potassium: 4.2 mmol/L (ref 3.5–5.1)
Sodium: 141 mmol/L (ref 135–145)
Total Bilirubin: 0.4 mg/dL (ref 0.3–1.2)
Total Protein: 7.1 g/dL (ref 6.5–8.1)

## 2022-07-24 LAB — DIFFERENTIAL
Abs Immature Granulocytes: 0 10*3/uL (ref 0.00–0.07)
Basophils Absolute: 0 10*3/uL (ref 0.0–0.1)
Basophils Relative: 1 %
Eosinophils Absolute: 0 10*3/uL (ref 0.0–0.5)
Eosinophils Relative: 1 %
Immature Granulocytes: 0 %
Lymphocytes Relative: 36 %
Lymphs Abs: 1.8 10*3/uL (ref 0.7–4.0)
Monocytes Absolute: 0.6 10*3/uL (ref 0.1–1.0)
Monocytes Relative: 11 %
Neutro Abs: 2.6 10*3/uL (ref 1.7–7.7)
Neutrophils Relative %: 51 %

## 2022-07-24 LAB — PROTIME-INR
INR: 1 (ref 0.8–1.2)
Prothrombin Time: 13.4 seconds (ref 11.4–15.2)

## 2022-07-24 LAB — CBC
HCT: 37.6 % (ref 36.0–46.0)
Hemoglobin: 11.7 g/dL — ABNORMAL LOW (ref 12.0–15.0)
MCH: 27.3 pg (ref 26.0–34.0)
MCHC: 31.1 g/dL (ref 30.0–36.0)
MCV: 87.6 fL (ref 80.0–100.0)
Platelets: 311 10*3/uL (ref 150–400)
RBC: 4.29 MIL/uL (ref 3.87–5.11)
RDW: 16.3 % — ABNORMAL HIGH (ref 11.5–15.5)
WBC: 5.1 10*3/uL (ref 4.0–10.5)
nRBC: 0 % (ref 0.0–0.2)

## 2022-07-24 LAB — RAPID URINE DRUG SCREEN, HOSP PERFORMED
Amphetamines: NOT DETECTED
Barbiturates: NOT DETECTED
Benzodiazepines: NOT DETECTED
Cocaine: NOT DETECTED
Opiates: NOT DETECTED
Tetrahydrocannabinol: NOT DETECTED

## 2022-07-24 LAB — APTT: aPTT: 26 seconds (ref 24–36)

## 2022-07-24 LAB — PREGNANCY, URINE: Preg Test, Ur: NEGATIVE

## 2022-07-24 LAB — CBG MONITORING, ED: Glucose-Capillary: 91 mg/dL (ref 70–99)

## 2022-07-24 LAB — ETHANOL: Alcohol, Ethyl (B): <10 mg/dL (ref <10)

## 2022-07-24 MED ORDER — SODIUM CHLORIDE 0.9% FLUSH
3.0000 mL | Freq: Once | INTRAVENOUS | Status: AC
  Administered 2022-07-24: 3 mL via INTRAVENOUS
  Filled 2022-07-24: qty 3

## 2022-07-24 MED ORDER — MAGNESIUM SULFATE 2 GM/50ML IV SOLN
2.0000 g | Freq: Once | INTRAVENOUS | Status: AC
  Administered 2022-07-24: 2 g via INTRAVENOUS
  Filled 2022-07-24: qty 50

## 2022-07-24 MED ORDER — DIPHENHYDRAMINE HCL 50 MG/ML IJ SOLN
12.5000 mg | Freq: Once | INTRAMUSCULAR | Status: AC
  Administered 2022-07-24: 12.5 mg via INTRAVENOUS
  Filled 2022-07-24: qty 1

## 2022-07-24 MED ORDER — IOHEXOL 350 MG/ML SOLN
100.0000 mL | Freq: Once | INTRAVENOUS | Status: AC | PRN
  Administered 2022-07-24: 100 mL via INTRAVENOUS

## 2022-07-24 MED ORDER — KETOROLAC TROMETHAMINE 15 MG/ML IJ SOLN
15.0000 mg | Freq: Once | INTRAMUSCULAR | Status: AC
  Administered 2022-07-24: 15 mg via INTRAVENOUS
  Filled 2022-07-24: qty 1

## 2022-07-24 MED ORDER — METOCLOPRAMIDE HCL 5 MG/ML IJ SOLN
10.0000 mg | Freq: Once | INTRAMUSCULAR | Status: AC
  Administered 2022-07-24: 10 mg via INTRAVENOUS
  Filled 2022-07-24: qty 2

## 2022-07-24 MED ORDER — LACTATED RINGERS IV BOLUS
1000.0000 mL | Freq: Once | INTRAVENOUS | Status: AC
  Administered 2022-07-24: 1000 mL via INTRAVENOUS

## 2022-07-24 NOTE — ED Provider Notes (Addendum)
MRI is negative for acute stroke.  Patient is still having headache. Based on Dr. Elnora Morrison note he reports if MRI neg pt needs to be treated for migraine. Concerned that this may be a complex migraine.  Patient treated with a headache cocktail. Pt had minimal improve with reglan and toradol and then received magnesium and benadryl with improvement but still having mild headache.  Will d/c home and have pt follow up with neurology.  Outpt referral was placed.   Blanchie Dessert, MD 07/24/22 Ayesha Rumpf, MD 07/24/22 2007

## 2022-07-24 NOTE — ED Provider Notes (Addendum)
[place] [facility] [place] Provider Note   CSN: [id number] Arrival date & time: 07/24/22  [id number]     History  Chief Complaint  Patient presents with   Facial Droop    Megan Hale is a 47 y.o. female.  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  Patient used to be followed by [facility] neurology last saw them May 2022.  Their notes indicate patient has a history of chronic intermittent headaches hypertension high cholesterol obesity the CVA mentioned has had episodic episodes of the headache right side of face numbness and facial droop.  And the same for numbness of the leg on and off.  Patient states she started with a headache on Friday.  Last evening at 2200 started to notice right-sided face tingling.  This morning she noted that there was a right-sided facial drooping.  No leg or upper extremity symptoms today.  Denies numbness to the face just talks about tingling.  Nursing noted that when she arrived seem to be some drooping of the right corner of her mouth and has some drooping of her right eye.  Currently that seems to have resolved.  Past medical history does mention migraines.  Neurology note never created the migraines as a complicated migraine for her symptoms.  Patient does states she has not had any symptoms like this for a long time.  But definitely did have them in the past.       Home Medications Prior to Admission medications   Medication Sig Start Date End Date Taking? Authorizing Provider  atorvastatin (LIPITOR) 40 MG tablet Take 40 mg by mouth every evening. 05/27/20   [provider]  buPROPion (WELLBUTRIN XL) 300 MG 24 hr tablet Take 300 mg by mouth in the morning. 06/29/20   [provider]  Cholecalciferol (VITAMIN D-3) 125 MCG (5000 UT) TABS Take 5,000 Units by mouth 3 (three) times a week.    [provider]  citalopram (CELEXA) 10 MG tablet Take 10 mg by mouth in the  morning. 06/29/20   [provider]  clopidogrel (PLAVIX) 75 MG tablet Take 75 mg by mouth in the morning. 05/27/20   [provider]  fluticasone (FLONASE) 50 MCG/ACT nasal spray Place 1 spray into both nostrils See admin instructions. Instill 1 spray into each nostril (scheduled) every morning & may instill an additional dose in the evening if needed for allergies.    [provider]  fluticasone-salmeterol (ADVAIR HFA) 115-21 MCG/ACT inhaler Inhale 2 puffs into the lungs 2 (two) times daily.    [provider]  lisinopril (ZESTRIL) 10 MG tablet Take 10 mg by mouth in the morning and at bedtime. Morning & night    [provider]  loratadine (CLARITIN) 10 MG tablet Take 10 mg by mouth in the morning. AllerClear    [provider]  methocarbamol (ROBAXIN) 750 MG tablet Take 750 mg by mouth daily as needed for muscle spasms.    [provider]  montelukast (SINGULAIR) 10 MG tablet Take 10 mg by mouth in the morning.    [provider]  Multiple Vitamin (MULTIVITAMIN WITH MINERALS) TABS tablet Take 1 tablet by mouth in the morning.    [provider]      Allergies    Patient has no known allergies.    Review of Systems   Review of Systems  Constitutional:  Negative for chills and fever.  HENT:  Negative  for ear pain and sore throat.   Eyes:  Negative for pain and visual disturbance.  Respiratory:  Negative for cough and shortness of breath.   Cardiovascular:  Negative for chest pain and palpitations.  Gastrointestinal:  Negative for abdominal pain and vomiting.  Genitourinary:  Negative for dysuria and hematuria.  Musculoskeletal:  Negative for arthralgias and back pain.  Skin:  Negative for color change and rash.  Neurological:  Positive for facial asymmetry, numbness and headaches. Negative for seizures, syncope and speech difficulty.  All other systems reviewed and are negative.   Physical Exam Updated  Vital Signs BP (!) 157/105 (BP Location: Right Arm)   Pulse 79   Temp 98.2 F (36.8 C) (Oral)   Ht 1.689 m (5' 6.5")   Wt 122.5 kg   LMP 07/17/2022 (Exact Date)   SpO2 98%   BMI 42.93 kg/m  Physical Exam Vitals and nursing note reviewed.  Constitutional:      General: She is not in acute distress.    Appearance: Normal appearance. She is well-developed. She is obese.  HENT:     Head: Normocephalic and atraumatic.  Eyes:     Extraocular Movements: Extraocular movements intact.     Conjunctiva/sclera: Conjunctivae normal.     Pupils: Pupils are equal, round, and reactive to light.  Cardiovascular:     Rate and Rhythm: Normal rate and regular rhythm.     Heart sounds: No murmur heard. Pulmonary:     Effort: Pulmonary effort is normal. No respiratory distress.     Breath sounds: Normal breath sounds.  Abdominal:     Palpations: Abdomen is soft.     Tenderness: There is no abdominal tenderness.  Musculoskeletal:        General: No swelling.     Cervical back: Normal range of motion and neck supple.  Skin:    General: Skin is warm and dry.     Capillary Refill: Capillary refill takes less than 2 seconds.  Neurological:     General: No focal deficit present.     Mental Status: She is alert and oriented to person, place, and time.     Cranial Nerves: No cranial nerve deficit.     Sensory: No sensory deficit.     Motor: No weakness.     Comments: No right-sided facial drooping currently forehead is spared.  No eye drooping.  Patient still states that she is got some tingling on the right side of the face.  Does not describe it as numbness.  No upper extremity or lower extremity numbness or weakness.  No speech abnormalities.  No visual abnormality.  Psychiatric:        Mood and Affect: Mood normal.     ED Results / Procedures / Treatments   Labs (all labs ordered are listed, but only abnormal results are displayed) Labs Reviewed  RESP PANEL BY RT-PCR (RSV, FLU A&B, COVID)   RVPGX2  PROTIME-INR  APTT  CBC  DIFFERENTIAL  COMPREHENSIVE METABOLIC PANEL  ETHANOL  PREGNANCY, URINE  APTT  RAPID URINE DRUG SCREEN, HOSP PERFORMED  URINALYSIS, ROUTINE W REFLEX MICROSCOPIC  CBG MONITORING, ED  CBG MONITORING, ED    EKG EKG Interpretation  Date/Time:  Monday July 24 2022 08:56:01 EST Ventricular Rate:  60 PR Interval:  206 QRS Duration: 93 QT Interval:  414 QTC Calculation: 414 R Axis:   19 Text Interpretation: Sinus arrhythmia Borderline prolonged PR interval Confirmed by Fredia Sorrow [phone]) on 07/24/2022 9:26:43 AM  Radiology CT  HEAD WO CONTRAST  Result Date: 07/24/2022 CLINICAL DATA:  Neuro deficit, acute, stroke suspected. Headache, lip tingling, and right facial droop. History of stroke. EXAM: CT HEAD WITHOUT CONTRAST TECHNIQUE: Contiguous axial images were obtained from the base of the skull through the vertex without intravenous contrast. RADIATION DOSE REDUCTION: This exam was performed according to the departmental dose-optimization program which includes automated exposure control, adjustment of the mA and/or kV according to patient size and/or use of iterative reconstruction technique. COMPARISON:  Head and neck CTA 10/14/2020.  Head MRI 07/12/2020. FINDINGS: Brain: There is no evidence of an acute infarct, intracranial hemorrhage, mass, midline shift, or extra-axial fluid collection. The ventricles and sulci are normal. 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 A chronic infarct in the left basal ganglia and corona radiata is unchanged. Vascular: Calcified atherosclerosis at the skull base. No hyperdense vessel. Skull: No acute fracture or suspicious osseous lesion. Sinuses/Orbits: Visualized paranasal sinuses and mastoid air cells are clear. Unremarkable orbits. Other: None. IMPRESSION: 1. No evidence of acute intracranial abnormality. 2. Chronic left basal ganglia/corona radiata infarct. Electronically Signed    By: Logan Bores M.D.   On: 07/24/2022 09:25    Procedures Procedures    Medications Ordered in ED Medications  sodium chloride flush (NS) 0.9 % injection 3 mL (has no administration in time range)    ED Course/ Medical Decision Making/ A&P                             Medical Decision Making Amount and/or Complexity of Data Reviewed Labs: ordered. Radiology: ordered.  Risk Prescription drug management.   CRITICAL CARE Performed by: Fredia Sorrow Total critical care time: 40 minutes Critical care time was exclusive of separately billable procedures and treating other patients. Critical care was necessary to treat or prevent imminent or life-threatening deterioration. Critical care was time spent personally by me on the following activities: development of treatment plan with patient and/or surrogate as well as nursing, discussions with consultants, evaluation of patient's response to treatment, examination of patient, obtaining history from patient or surrogate, ordering and performing treatments and interventions, ordering and review of laboratory studies, ordering and review of radiographic studies, pulse oximetry and re-evaluation of patient's condition.  Currently no focal neurodeficit have some numbness to the right side of the face patient definitely with documented left lacunar infarct in 2013.  Symptoms described by [facility] neurology at their last visit which was in May 2022 that she had episodic symptoms of today's presentation.  Patient out of the tPA stroke window.  And all symptoms seem to be mostly resolved now other than the right facial numbness.  Or tingling.  Will do stroke workup.  Patient may need transfer in for MRI.  Review of [place] neurology notes talk about fairly significant cerebral artery disease.  So may as well do CT angio head and neck here today prior to transfer in for MRI.  Will discuss with on-call neurology prior to ordering the  MRI.  Head CT shows evidence of the remote infarct that was known from the past.  CT angio head and neck shows some worsening cerebral vascular disease.  Will discuss with teleneurology.  Patient still has the tingling sensation to the right side of the face is the only symptom.  Discussed with Dr. Malen Gauze neurology.  Recommending patient be sent in to [facility] for MRI.  If MRI negative can be discharged  home with follow-up with [facility] neurology.  If positive they need to be called.  Feels that the symptoms have been recurrent in this area for a while so not recommending direct admission at this time.  Dr. Vanita Panda accepting for ED to ED transfer to [facility].  Final Clinical Impression(s) / ED Diagnoses Final diagnoses:  Cerebrovascular accident (CVA), unspecified mechanism [facility])    Rx / DC Orders ED Discharge Orders     None         Fredia Sorrow, MD 07/24/22 [id number]    Fredia Sorrow, MD 07/24/22 [id number]    Fredia Sorrow, MD 07/24/22 1229    Fredia Sorrow, MD 07/24/22 1246    Fredia Sorrow, MD 07/24/22 1247    Fredia Sorrow, MD 07/24/22 1253    Fredia Sorrow, MD 07/24/22 [phone]

## 2022-07-24 NOTE — ED Notes (Signed)
Called [facility] for Transfer to [facility] ED.  Dr. Vanita Panda Accepting

## 2022-07-24 NOTE — Discharge Instructions (Signed)
The MRI today was normal.  This may all be related to a complicated migraine.  Somebody from the neurology office should call you for a follow-up appointment.  If you start experiencing symptoms in your arm or leg.  Inability to speak or any vision loss you should return to the emergency room immediately.

## 2022-07-24 NOTE — ED Triage Notes (Signed)
Patient bib [facility] with complaints of tingling and facial droop. She does have a history of stroke. Patient transferred here for MRI.

## 2022-07-24 NOTE — Plan of Care (Addendum)
ON CALL PHONE CONSULT  Call from [facility]. [email or web address] [place]   Discussion: Patient with a history of prior lacunar infarction and multiple episodes of paresthesias of the face arm and leg came in with right facial paresthesias and possible right eyelid and facial droop along with headache.  Symptoms have resolved.  She has been seen in the past as an acute code stroke without positive imaging.  Symptoms have now resolved except for subjective sensory symptoms. At this point, I would recommend, transfer to [facility] for an MRI brain.  If that is positive, admit for further stroke risk factor workup.  If that is negative, treat her with a migraine cocktail and have her follow-up with outpatient neurology for further workup.  -- [facility], MD Neurologist [facility] Pager: [phone]

## 2022-07-24 NOTE — ED Notes (Signed)
Patient back from MRI.

## 2022-07-24 NOTE — ED Triage Notes (Signed)
Pt states she started with a headache on Friday.  Yesterday evening she noticed her lips felt tingling and face felt funny yesterday evening.  She looked in the mirror this am and noticed right facial drooping.  Pt has history of stroke and has some baseline right sided weakness.No increase weakness in the right, no ambulation changes.  Some right facial drooping noted to right side of mouth and slight droop of the right eye.  No numbness in sides of face, only tingling to right lower lip corner.

## 2022-08-11 ENCOUNTER — Emergency Department (HOSPITAL_BASED_OUTPATIENT_CLINIC_OR_DEPARTMENT_OTHER)
Admission: EM | Admit: 2022-08-11 | Discharge: 2022-08-12 | Disposition: A | Discharge status: Home / Self Care | Payer: Medicare HMO | Attending: Emergency Medicine | Admitting: Emergency Medicine

## 2022-08-11 ENCOUNTER — Emergency Department (HOSPITAL_COMMUNITY): Payer: Medicare HMO

## 2022-08-11 ENCOUNTER — Other Ambulatory Visit: Payer: Self-pay

## 2022-08-11 ENCOUNTER — Encounter (HOSPITAL_BASED_OUTPATIENT_CLINIC_OR_DEPARTMENT_OTHER): Payer: Self-pay

## 2022-08-11 DIAGNOSIS — R202 Paresthesia of skin: Secondary | ICD-10-CM | POA: Diagnosis present

## 2022-08-11 DIAGNOSIS — R519 Headache, unspecified: Secondary | ICD-10-CM | POA: Insufficient documentation

## 2022-08-11 DIAGNOSIS — R200 Anesthesia of skin: Secondary | ICD-10-CM

## 2022-08-11 LAB — CBG MONITORING, ED: Glucose-Capillary: 69 mg/dL — ABNORMAL LOW (ref 70–99)

## 2022-08-11 LAB — BASIC METABOLIC PANEL
Anion gap: 4 — ABNORMAL LOW (ref 5–15)
BUN: 14 mg/dL (ref 6–20)
CO2: 23 mmol/L (ref 22–32)
Calcium: 7.8 mg/dL — ABNORMAL LOW (ref 8.9–10.3)
Chloride: 112 mmol/L — ABNORMAL HIGH (ref 98–111)
Creatinine, Ser: 0.81 mg/dL (ref 0.44–1.00)
GFR, Estimated: >60 mL/min (ref >60)
Glucose, Bld: 63 mg/dL — ABNORMAL LOW (ref 70–99)
Potassium: 3.9 mmol/L (ref 3.5–5.1)
Sodium: 139 mmol/L (ref 135–145)

## 2022-08-11 LAB — URINALYSIS, MICROSCOPIC (REFLEX)

## 2022-08-11 LAB — CBC
HCT: 38.2 % (ref 36.0–46.0)
Hemoglobin: 12.2 g/dL (ref 12.0–15.0)
MCH: 27.6 pg (ref 26.0–34.0)
MCHC: 31.9 g/dL (ref 30.0–36.0)
MCV: 86.4 fL (ref 80.0–100.0)
Platelets: 302 10*3/uL (ref 150–400)
RBC: 4.42 MIL/uL (ref 3.87–5.11)
RDW: 16.6 % — ABNORMAL HIGH (ref 11.5–15.5)
WBC: 6.2 10*3/uL (ref 4.0–10.5)
nRBC: 0 % (ref 0.0–0.2)

## 2022-08-11 LAB — URINALYSIS, ROUTINE W REFLEX MICROSCOPIC
Glucose, UA: NEGATIVE mg/dL
Ketones, ur: NEGATIVE mg/dL
Leukocytes,Ua: NEGATIVE
Nitrite: NEGATIVE
Protein, ur: NEGATIVE mg/dL
Specific Gravity, Urine: >=1.03 (ref 1.005–1.030)
pH: 5.5 (ref 5.0–8.0)

## 2022-08-11 NOTE — ED Triage Notes (Signed)
Pt to er, pt states that she was seen here a couple of weeks ago for some facial droop, states that she was told that she had a migraine, states that she followed up with a neurologist and was told that she may have had a tia, states that she has had a headache since, states that she is here today for some tingling in her L arm and leg, denies any slurred speech or facial droop. MD at bedside for fast exam.  Pt denies weakness.

## 2022-08-11 NOTE — ED Notes (Signed)
Reported 69 CBG to RN 6 oz orange juice given to patient

## 2022-08-11 NOTE — ED Provider Notes (Signed)
EMER[facility]ENCY DEPARTMENT AT [facility] [place] Provider Note   CSN: [id number] Arrival date & time: 08/11/22  1321     History Chief Complaint  Patient presents with   Numbness    HPI Megan Hale is a 47 y.o. female presenting for chief complaint of left-sided hemibody numbness.  She is a 47 year old female who has a history of strokes.  States that approximately a month and a half ago she was seen for suspected CVA/TIA.  Had a reassuring workup and diagnosis was thought to be atypical migraines and patient was discharged at that time. 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  However this morning from approximately 9 30-12 30 she had left hemibody numbness.  Denies word finding difficulties or motor weakness.  Acute neurologic symptoms have completely resolved in the interim. She has had her headache persistently over the last month however.  Patient's recorded medical, surgical, social, medication list and allergies were reviewed in the Snapshot window as part of the initial history.   Review of Systems   Review of Systems  Constitutional:  Negative for chills and fever.  HENT:  Negative for ear pain and sore throat.   Eyes:  Negative for pain and visual disturbance.  Respiratory:  Negative for cough and shortness of breath.   Cardiovascular:  Negative for chest pain and palpitations.  Gastrointestinal:  Negative for abdominal pain and vomiting.  Genitourinary:  Negative for dysuria and hematuria.  Musculoskeletal:  Negative for arthralgias and back pain.  Skin:  Negative for color change and rash.  Neurological:  Positive for numbness and headaches. Negative for seizures, syncope and facial asymmetry.  All other systems reviewed and are negative.   Physical Exam Updated Vital Signs BP 122/71 (BP Location: Right Arm)    Pulse 64   Temp 97.9 F (36.6 C) (Oral)   Resp 16   Ht 5' 6.5" (1.689 m)   Wt 118.4 kg   LMP 07/17/2022 (Exact Date)   SpO2 100%   BMI 41.50 kg/m  Physical Exam Vitals and nursing note reviewed.  Constitutional:      General: She is not in acute distress.    Appearance: She is well-developed.  HENT:     Head: Normocephalic and atraumatic.  Eyes:     Conjunctiva/sclera: Conjunctivae normal.  Cardiovascular:     Rate and Rhythm: Normal rate and regular rhythm.     Heart sounds: No murmur heard. Pulmonary:     Effort: Pulmonary effort is normal. No respiratory distress.     Breath sounds: Normal breath sounds.  Abdominal:     General: There is no distension.     Palpations: Abdomen is soft.     Tenderness: There is no abdominal tenderness. There is no right CVA tenderness or left CVA tenderness.  Musculoskeletal:        General: No swelling or tenderness. Normal range of motion.     Cervical back: Neck supple.  Skin:    General: Skin is warm and dry.  Neurological:     General: No focal deficit present.     Mental Status: She is alert and oriented to person, place, and time. Mental status is at baseline.     Cranial Nerves: No cranial nerve deficit.      ED Course/ Medical Decision Making/ A&P    Procedures Procedures   Medications  Ordered in ED Medications - No data to display  Medical Decision Making:    Megan Hale is a 47 y.o. female who presented to the ED today with left-sided numbness detailed above.     Additional history discussed with patient's family/caregivers.  Patient's presentation is complicated by their history of multiple comorbid medical problems.  Patient placed on continuous vitals and telemetry monitoring while in ED which was reviewed periodically.   Complete initial physical exam performed, notably the patient  was hemodynamically stable in no acute distress.  Currently asymptomatic.      Reviewed and confirmed nursing  documentation for past medical history, family history, social history.    Initial Assessment:   Patient's history of present on some physical exam findings are most consistent with nonspecific etiology.  I am concerned for TIA again given left-sided weakness.  ABCD2 score is 4 based on her history of diabetes, duration of symptoms and elevated diastolic pressure. I consulted neurology based on these findings who recommended transfer to main campus for MRI and neurologic evaluation. Dr. Gilford Raid aware of phone call and accepted in transfer. Reassessed patient prior to transfer, she would like to proceed with further diagnostic evaluation but denies any recurrence of symptoms at this time. Stable for transfer with no acute indication for further intervention at this time.  Clinical Impression:  1. Numbness      Data Unavailable   Final Clinical Impression(s) / ED Diagnoses Final diagnoses:  Numbness    Rx / DC Orders ED Discharge Orders     None         Tretha Sciara, MD 08/11/22 1815

## 2022-08-11 NOTE — ED Provider Notes (Signed)
Arrives via [facility] as transfer from [facility] for MRI. No new complaints or concerns. Awaiting MRI.   Tacy Learn, PA-C 08/11/22 2011    Lorelle Gibbs, DO 08/12/22 1531

## 2022-08-11 NOTE — ED Notes (Signed)
Pt arrived from [facility] by [place], pt transferred here for MRI.  Pt denies changes in transport  NAD. Pt is able to speak in full sentences and denies pain at this time

## 2022-08-12 NOTE — Discharge Instructions (Signed)
Your seen today showed no signs of a stroke.  Please follow-up with your neurologist to continued trigger point injections.  Return to the emergency department if you have any new or concerning symptoms.

## 2022-08-12 NOTE — ED Provider Notes (Signed)
Patient transferred from [facility] with transient left-sided paresthesias, history of stroke.  She was sent for MRI to rule out acute stroke.  MRI shows no evidence of new stroke.  I have independently viewed the images, and agree with radiologist's interpretation.  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  She is advised to continue with that follow-up.  Results for orders placed or performed during the hospital encounter of 08/11/22  CBC  Result Value Ref Range   WBC 6.2 4.0 - 10.5 K/uL   RBC 4.42 3.87 - 5.11 MIL/uL   Hemoglobin 12.2 12.0 - 15.0 g/dL   HCT 38.2 36.0 - 46.0 %   MCV 86.4 80.0 - 100.0 fL   MCH 27.6 26.0 - 34.0 pg   MCHC 31.9 30.0 - 36.0 g/dL   RDW 16.6 (H) 11.5 - 15.5 %   Platelets 302 150 - 400 K/uL   nRBC 0.0 0.0 - 0.2 %  Urinalysis, Routine w reflex microscopic -Urine, Clean Catch  Result Value Ref Range   Color, Urine YELLOW YELLOW   APPearance CLEAR CLEAR   Specific Gravity, Urine >=1.030 1.005 - 1.030   pH 5.5 5.0 - 8.0   Glucose, UA NEGATIVE NEGATIVE mg/dL   Hgb urine dipstick MODERATE (A) NEGATIVE   Bilirubin Urine SMALL (A) NEGATIVE   Ketones, ur NEGATIVE NEGATIVE mg/dL   Protein, ur NEGATIVE NEGATIVE mg/dL   Nitrite NEGATIVE NEGATIVE   Leukocytes,Ua NEGATIVE NEGATIVE  Urinalysis, Microscopic (reflex)  Result Value Ref Range   RBC / HPF 6-10 0 - 5 RBC/hpf   WBC, UA 0-5 0 - 5 WBC/hpf   Bacteria, UA FEW (A) NONE SEEN   Squamous Epithelial / HPF 6-10 0 - 5 /HPF  Basic metabolic panel  Result Value Ref Range   Sodium 139 135 - 145 mmol/L   Potassium 3.9 3.5 - 5.1 mmol/L   Chloride 112 (H) 98 - 111 mmol/L   CO2 23 22 - 32 mmol/L   Glucose, Bld 63 (L) 70 - 99 mg/dL   BUN 14 6 - 20 mg/dL   Creatinine, Ser 0.81 0.44 - 1.00 mg/dL   Calcium 7.8 (L) 8.9 - 10.3 mg/dL   GFR, Estimated >60 >60 mL/min   Anion gap 4 (L) 5 - 15  CBG monitoring, ED  Result  Value Ref Range   Glucose-Capillary 69 (L) 70 - 99 mg/dL   Comment 1 Notify RN    MR BRAIN WO CONTRAST  Result Date: 08/11/2022 CLINICAL DATA:  Transient ischemic attack EXAM: MRI HEAD WITHOUT CONTRAST TECHNIQUE: Multiplanar, multiecho pulse sequences of the brain and surrounding structures were obtained without intravenous contrast. COMPARISON:  07/24/2022 FINDINGS: Brain: No restricted diffusion to suggest acute or subacute infarct. No acute hemorrhage, mass, mass effect, or midline shift. No hydrocephalus or extra-axial collection. Partial empty sella. Normal craniocervical junction. Redemonstrated remote lacunar infarcts in the right pons and in the in the left basal ganglia and corona radiata. No interval cortical or lacunar infarct. Vascular: Normal arterial flow voids. Skull and upper cervical spine: Normal marrow signal. Sinuses/Orbits: Mild mucosal thickening in left maxillary sinus. Otherwise clear paranasal sinuses. No acute finding in the orbits. Other: The mastoid air cells are well aerated. IMPRESSION: No acute intracranial process. No evidence of acute or subacute infarct. Electronically Signed   By: Merilyn Baba M.D.   On: 08/11/2022 22:03   MR Brain Wo Contrast (neuro protocol)  Result Date: 07/24/2022 CLINICAL DATA:  Neuro deficit, acute, stroke suspected EXAM: MRI HEAD WITHOUT CONTRAST TECHNIQUE: Multiplanar, multiecho pulse sequences of the brain and surrounding structures were obtained without intravenous contrast. COMPARISON:  CT head from the same day. FINDINGS: Brain: The no acute infarction, hemorrhage, hydrocephalus, extra-axial collection or mass lesion. Small remote perforator infarct in the basal ganglia and overlying white matter. Vascular: Major arterial flow voids are maintained at the skull base. Skull and upper cervical spine: Normal marrow signal. Sinuses/Orbits: Largely clear sinuses.  No acute orbital findings. Other: No mastoid effusions. IMPRESSION: No evidence of  acute intracranial abnormality. Electronically Signed   By: Margaretha Sheffield M.D.   On: 07/24/2022 17:39   CT ANGIO HEAD NECK W WO CM  Result Date: 07/24/2022 CLINICAL DATA:  Headache 3 days ago followed by lip tingling and altered sensation in face yesterday. Right facial droop notice this morning. History of stroke with a baseline right-sided weakness EXAM: CT ANGIOGRAPHY HEAD AND NECK TECHNIQUE: Multidetector CT imaging of the head and neck was performed using the standard protocol during bolus administration of intravenous contrast. Multiplanar CT image reconstructions and MIPs were obtained to evaluate the vascular anatomy. Carotid stenosis measurements (when applicable) are obtained utilizing NASCET criteria, using the distal internal carotid diameter as the denominator. RADIATION DOSE REDUCTION: This exam was performed according to the departmental dose-optimization program which includes automated exposure control, adjustment of the mA and/or kV according to patient size and/or use of iterative reconstruction technique. CONTRAST:  181m OMNIPAQUE IOHEXOL 350 MG/ML SOLN COMPARISON:  Same-day noncontrast CT head, CTA head/neck 10/14/2020 FINDINGS: CTA NECK FINDINGS Aortic arch: The imaged aortic arch is normal. The origins of the major branch vessels are patent. The subclavian arteries are patent to the level imaged. Right carotid system: The right common, internal, and external carotid arteries are patent, without hemodynamically significant stenosis or occlusion there is no dissection or aneurysm. Left carotid system: The left common, internal, and external carotid arteries are patent, without hemodynamically significant stenosis or occlusion. There is no dissection or aneurysm. Vertebral arteries: The vertebral arteries are patent, without hemodynamically significant stenosis or occlusion. There is no dissection or aneurysm. Skeleton: There is no acute osseous abnormality or suspicious osseous lesion  there is multilevel degenerative change of the cervical spine. There is no definite canal hematoma. Other neck: The soft tissues of the neck are unremarkable. Upper chest: The imaged lung apices are clear. Review of the MIP images confirms the above findings CTA HEAD FINDINGS Anterior circulation: There is calcified plaque in the left carotid siphon without significant stenosis. The right carotid siphon is patent. The bilateral M1 segments are patent. There is moderate stenosis at the origin of the superior right M2 branch (7-236) which appears slightly progressed since 2022 and mild stenosis in the inferior left M2 branch proximally (7-237). There is no high-grade stenosis or occlusion. The right A1 segment is relatively diminutive, a normal variant with superimposed severe stenosis of the origin origin, not significantly changed. There is severe stenosis of the origin of the left A1 segment (11-36) worsened since 2022. The distal branches appear patent. There is no aneurysm or AVM Posterior circulation: The bilateral V4 segments are patent with moderate narrowing of the right just prior to the vertebrobasilar junction, similar to the prior study. The basilar artery is patent. The major cerebellar arteries appear patent. 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 This is similar to 2022. Moderate stenosis of the right V2 segment is  similar to the prior study (7-237). There is no other proximal high-grade stenosis or occlusion of the right PCA. A small right posterior communicating artery is identified. There is no aneurysm or AVM. Venous sinuses: Patent. Anatomic variants: As above. Review of the MIP images confirms the above findings IMPRESSION: 1. No emergent vascular finding. 2. 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 3. 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 Additional intracranial  atherosclerotic disease detailed above is overall not significantly changed. 4. No hemodynamically significant stenosis or occlusion in the neck. Electronically Signed   By: Valetta Mole M.D.   On: 07/24/2022 11:56   CT HEAD WO CONTRAST  Result Date: 07/24/2022 CLINICAL DATA:  Neuro deficit, acute, stroke suspected. Headache, lip tingling, and right facial droop. History of stroke. EXAM: CT HEAD WITHOUT CONTRAST TECHNIQUE: Contiguous axial images were obtained from the base of the skull through the vertex without intravenous contrast. RADIATION DOSE REDUCTION: This exam was performed according to the departmental dose-optimization program which includes automated exposure control, adjustment of the mA and/or kV according to patient size and/or use of iterative reconstruction technique. COMPARISON:  Head and neck CTA 10/14/2020.  Head MRI 07/12/2020. FINDINGS: Brain: There is no evidence of an acute infarct, intracranial hemorrhage, mass, midline shift, or extra-axial fluid collection. The ventricles and sulci are normal. 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 A chronic infarct in the left basal ganglia and corona radiata is unchanged. Vascular: Calcified atherosclerosis at the skull base. No hyperdense vessel. Skull: No acute fracture or suspicious osseous lesion. Sinuses/Orbits: Visualized paranasal sinuses and mastoid air cells are clear. Unremarkable orbits. Other: None. IMPRESSION: 1. No evidence of acute intracranial abnormality. 2. Chronic left basal ganglia/corona radiata infarct. Electronically Signed   By: Logan Bores M.D.   On: [phone]      Delora Fuel, MD [id number]

## 2022-11-25 ENCOUNTER — Encounter (HOSPITAL_COMMUNITY): Payer: Self-pay

## 2022-11-25 ENCOUNTER — Emergency Department (HOSPITAL_COMMUNITY)
Admission: EM | Admit: 2022-11-25 | Discharge: 2022-11-26 | Discharge status: Against Medical Advice | Payer: Medicare HMO | Attending: Emergency Medicine | Admitting: Emergency Medicine

## 2022-11-25 ENCOUNTER — Other Ambulatory Visit: Payer: Self-pay

## 2022-11-25 ENCOUNTER — Emergency Department (HOSPITAL_COMMUNITY): Payer: Medicare HMO

## 2022-11-25 DIAGNOSIS — Z5321 Procedure and treatment not carried out due to patient leaving prior to being seen by health care provider: Secondary | ICD-10-CM | POA: Diagnosis not present

## 2022-11-25 DIAGNOSIS — Y900 Blood alcohol level of less than 20 mg/100 ml: Secondary | ICD-10-CM | POA: Insufficient documentation

## 2022-11-25 DIAGNOSIS — Z7901 Long term (current) use of anticoagulants: Secondary | ICD-10-CM | POA: Insufficient documentation

## 2022-11-25 DIAGNOSIS — G43009 Migraine without aura, not intractable, without status migrainosus: Secondary | ICD-10-CM | POA: Diagnosis not present

## 2022-11-25 DIAGNOSIS — R519 Headache, unspecified: Secondary | ICD-10-CM | POA: Diagnosis present

## 2022-11-25 DIAGNOSIS — I10 Essential (primary) hypertension: Secondary | ICD-10-CM | POA: Diagnosis not present

## 2022-11-25 LAB — COMPREHENSIVE METABOLIC PANEL
ALT: 11 U/L (ref 0–44)
AST: 16 U/L (ref 15–41)
Albumin: 3.1 g/dL — ABNORMAL LOW (ref 3.5–5.0)
Alkaline Phosphatase: 61 U/L (ref 38–126)
Anion gap: 6 (ref 5–15)
BUN: 12 mg/dL (ref 6–20)
CO2: 24 mmol/L (ref 22–32)
Calcium: 8.6 mg/dL — ABNORMAL LOW (ref 8.9–10.3)
Chloride: 108 mmol/L (ref 98–111)
Creatinine, Ser: 0.87 mg/dL (ref 0.44–1.00)
GFR, Estimated: >60 mL/min (ref >60)
Glucose, Bld: 86 mg/dL (ref 70–99)
Potassium: 3.7 mmol/L (ref 3.5–5.1)
Sodium: 138 mmol/L (ref 135–145)
Total Bilirubin: 0.3 mg/dL (ref 0.3–1.2)
Total Protein: 6.7 g/dL (ref 6.5–8.1)

## 2022-11-25 LAB — I-STAT CHEM 8, ED
BUN: 13 mg/dL (ref 6–20)
Calcium, Ion: 1.1 mmol/L — ABNORMAL LOW (ref 1.15–1.40)
Chloride: 105 mmol/L (ref 98–111)
Creatinine, Ser: 0.9 mg/dL (ref 0.44–1.00)
Glucose, Bld: 86 mg/dL (ref 70–99)
HCT: 34 % — ABNORMAL LOW (ref 36.0–46.0)
Hemoglobin: 11.6 g/dL — ABNORMAL LOW (ref 12.0–15.0)
Potassium: 3.6 mmol/L (ref 3.5–5.1)
Sodium: 140 mmol/L (ref 135–145)
TCO2: 26 mmol/L (ref 22–32)

## 2022-11-25 LAB — DIFFERENTIAL
Abs Immature Granulocytes: 0.01 10*3/uL (ref 0.00–0.07)
Basophils Absolute: 0 10*3/uL (ref 0.0–0.1)
Basophils Relative: 1 %
Eosinophils Absolute: 0.1 10*3/uL (ref 0.0–0.5)
Eosinophils Relative: 1 %
Immature Granulocytes: 0 %
Lymphocytes Relative: 45 %
Lymphs Abs: 2.7 10*3/uL (ref 0.7–4.0)
Monocytes Absolute: 0.6 10*3/uL (ref 0.1–1.0)
Monocytes Relative: 9 %
Neutro Abs: 2.7 10*3/uL (ref 1.7–7.7)
Neutrophils Relative %: 44 %

## 2022-11-25 LAB — CBC
HCT: 34.7 % — ABNORMAL LOW (ref 36.0–46.0)
Hemoglobin: 10.8 g/dL — ABNORMAL LOW (ref 12.0–15.0)
MCH: 27.9 pg (ref 26.0–34.0)
MCHC: 31.1 g/dL (ref 30.0–36.0)
MCV: 89.7 fL (ref 80.0–100.0)
Platelets: 354 10*3/uL (ref 150–400)
RBC: 3.87 MIL/uL (ref 3.87–5.11)
RDW: 15 % (ref 11.5–15.5)
WBC: 6.1 10*3/uL (ref 4.0–10.5)
nRBC: 0 % (ref 0.0–0.2)

## 2022-11-25 LAB — URINALYSIS, ROUTINE W REFLEX MICROSCOPIC
Bilirubin Urine: NEGATIVE
Glucose, UA: NEGATIVE mg/dL
Ketones, ur: NEGATIVE mg/dL
Leukocytes,Ua: NEGATIVE
Nitrite: NEGATIVE
Protein, ur: NEGATIVE mg/dL
RBC / HPF: >50 RBC/hpf (ref 0–5)
Specific Gravity, Urine: 1.02 (ref 1.005–1.030)
pH: 5 (ref 5.0–8.0)

## 2022-11-25 LAB — PROTIME-INR
INR: 1 (ref 0.8–1.2)
Prothrombin Time: 13.4 seconds (ref 11.4–15.2)

## 2022-11-25 LAB — APTT: aPTT: 26 seconds (ref 24–36)

## 2022-11-25 LAB — PREGNANCY, URINE: Preg Test, Ur: NEGATIVE

## 2022-11-25 LAB — ETHANOL: Alcohol, Ethyl (B): <10 mg/dL (ref <10)

## 2022-11-25 NOTE — ED Triage Notes (Signed)
Pt arrived from home via POV c/o headache 10/10. Pt noted to have right sided deficits from previous stroke and had TIA in Feb only found d/t head ache that would not go away just like this one.

## 2022-11-26 ENCOUNTER — Emergency Department (HOSPITAL_BASED_OUTPATIENT_CLINIC_OR_DEPARTMENT_OTHER)
Admission: EM | Admit: 2022-11-26 | Discharge: 2022-11-26 | Disposition: A | Discharge status: Home / Self Care | Payer: Medicare HMO | Source: Home / Self Care | Attending: Emergency Medicine | Admitting: Emergency Medicine

## 2022-11-26 ENCOUNTER — Encounter (HOSPITAL_BASED_OUTPATIENT_CLINIC_OR_DEPARTMENT_OTHER): Payer: Self-pay | Admitting: Emergency Medicine

## 2022-11-26 ENCOUNTER — Other Ambulatory Visit: Payer: Self-pay

## 2022-11-26 DIAGNOSIS — I10 Essential (primary) hypertension: Secondary | ICD-10-CM | POA: Insufficient documentation

## 2022-11-26 DIAGNOSIS — G43009 Migraine without aura, not intractable, without status migrainosus: Secondary | ICD-10-CM | POA: Insufficient documentation

## 2022-11-26 DIAGNOSIS — Z7901 Long term (current) use of anticoagulants: Secondary | ICD-10-CM | POA: Insufficient documentation

## 2022-11-26 MED ORDER — METOCLOPRAMIDE HCL 5 MG/ML IJ SOLN
10.0000 mg | Freq: Once | INTRAMUSCULAR | Status: AC
  Administered 2022-11-26: 10 mg via INTRAVENOUS
  Filled 2022-11-26: qty 2

## 2022-11-26 MED ORDER — KETOROLAC TROMETHAMINE 15 MG/ML IJ SOLN
15.0000 mg | Freq: Once | INTRAMUSCULAR | Status: AC
  Administered 2022-11-26: 15 mg via INTRAVENOUS
  Filled 2022-11-26: qty 1

## 2022-11-26 MED ORDER — SODIUM CHLORIDE 0.9 % IV BOLUS
1000.0000 mL | Freq: Once | INTRAVENOUS | Status: AC
  Administered 2022-11-26: 1000 mL via INTRAVENOUS

## 2022-11-26 MED ORDER — DIPHENHYDRAMINE HCL 50 MG/ML IJ SOLN
25.0000 mg | Freq: Once | INTRAMUSCULAR | Status: AC
  Administered 2022-11-26: 25 mg via INTRAVENOUS
  Filled 2022-11-26: qty 1

## 2022-11-26 NOTE — ED Notes (Signed)
ED Provider at bedside for reassessment 

## 2022-11-26 NOTE — ED Notes (Signed)
Pt did not want to wait and left AMA. 

## 2022-11-26 NOTE — ED Provider Notes (Signed)
[facility] Provider Note: Megan Dell, MD, FACEP  CSN: [id number] MRN: [id number] ARRIVAL: 11/26/22 at 0245 ROOM: [facility]10/[facility]10   CHIEF COMPLAINT  Headache   HISTORY OF PRESENT ILLNESS  11/26/22 3:12 AM Megan Hale is a 46 y.o. female with a history of migraines and CVA with persistent mild right-sided deficits.  She is here with a headache for the past 2 days.  It is primarily on the top of her head and is described as a squeezing sensation.  There is somewhat more predominance on the right side than the left side.  She rates her pain as a 4 out of 10 presently.  She has not had nausea, vomiting or photophobia with this.  She has taken tramadol, which usually helps her headaches, and zonisamide (for the first time) without relief.   Past Medical History:  Diagnosis Date   Anxiety    CVA (cerebral vascular accident) (HCC)    Acute left Basal Ganglia - right hemiplagia - Dr Johnell Comings   Depression    Hyperlipidemia    Hypertension    Migraine    Morbid obesity (HCC)    OSA (obstructive sleep apnea)    Bi-Pap   Vitamin D deficiency     Past Surgical History:  Procedure Laterality Date   BUBBLE STUDY  01/12/2021   Procedure: BUBBLE STUDY;  Surgeon: Jake Bathe, MD;  Location: [facility] ENDOSCOPY;  Service: Cardiovascular;;   LAPAROSCOPIC GASTRIC SLEEVE RESECTION     TEE WITHOUT CARDIOVERSION N/A 01/12/2021   Procedure: TRANSESOPHAGEAL ECHOCARDIOGRAM (TEE);  Surgeon: Jake Bathe, MD;  Location: [facility] ENDOSCOPY;  Service: Cardiovascular;  Laterality: N/A;    Family History  Problem Relation Age of Onset   CVA Father 54   Hypertension Father    Stroke Mother    Heart disease Mother    Diabetes Mother    CVA Brother    Diabetes Brother        died of complications from Diabetes   Diabetes Sister    Heart attack Brother    Diabetes Brother    Stroke Paternal Grandfather     Social History   Tobacco Use   Smoking status: Never   Smokeless tobacco: Never   Vaping Use   Vaping Use: Never used  Substance Use Topics   Alcohol use: Not Currently   Drug use: No    Prior to Admission medications   Medication Sig Start Date End Date Taking? Authorizing Provider  traMADol (ULTRAM) 50 MG tablet Take 50 mg by mouth every 6 (six) hours as needed.   Yes [provider]  zonisamide (ZONEGRAN) 25 MG capsule Take 25 mg by mouth daily.   Yes [provider]  atorvastatin (LIPITOR) 40 MG tablet Take 40 mg by mouth every evening. 05/27/20   [provider]  buPROPion (WELLBUTRIN XL) 300 MG 24 hr tablet Take 300 mg by mouth in the morning. 06/29/20   [provider]  Cholecalciferol (VITAMIN D-3) 125 MCG (5000 UT) TABS Take 5,000 Units by mouth 3 (three) times a week.    [provider]  citalopram (CELEXA) 10 MG tablet Take 10 mg by mouth in the morning. 06/29/20   [provider]  clopidogrel (PLAVIX) 75 MG tablet Take 75 mg by mouth in the morning. 05/27/20   [provider]  fluticasone (FLONASE) 50 MCG/ACT nasal spray Place 1 spray into both nostrils See admin instructions. Instill 1 spray into each nostril (scheduled) every morning & may instill  an additional dose in the evening if needed for allergies.    [provider]  fluticasone-salmeterol (ADVAIR HFA) 115-21 MCG/ACT inhaler Inhale 2 puffs into the lungs 2 (two) times daily.    [provider]  lisinopril (ZESTRIL) 10 MG tablet Take 10 mg by mouth in the morning and at bedtime. Morning & night    [provider]  loratadine (CLARITIN) 10 MG tablet Take 10 mg by mouth in the morning. AllerClear    [provider]  methocarbamol (ROBAXIN) 750 MG tablet Take 750 mg by mouth daily as needed for muscle spasms.    [provider]  montelukast (SINGULAIR) 10 MG tablet Take 10 mg by mouth in the morning.    [provider]  Multiple Vitamin (MULTIVITAMIN WITH MINERALS) TABS tablet Take 1 tablet  by mouth in the morning.    [provider]    Allergies Patient has no known allergies.   REVIEW OF SYSTEMS  Negative except as noted here or in the History of Present Illness.   PHYSICAL EXAMINATION  Initial Vital Signs Blood pressure 131/84, pulse 61, last menstrual period 11/25/2022, SpO2 100 %.  Examination General: Well-developed, well-nourished female in no acute distress; appearance consistent with age of record HENT: normocephalic; atraumatic Eyes: pupils equal, round and reactive to light; extraocular muscles intact Neck: supple Heart: regular rate and rhythm Lungs: clear to auscultation bilaterally Abdomen: soft; nondistended; nontender; bowel sounds present Extremities: No deformity; full range of motion; pulses normal Neurologic: Awake, alert and oriented; slight right hemiparesis; no facial droop Skin: Warm and dry Psychiatric: Normal mood and affect   RESULTS  Summary of this visit's results, reviewed and interpreted by myself:   EKG Interpretation  Date/Time:    Ventricular Rate:    PR Interval:    QRS Duration:   QT Interval:    QTC Calculation:   R Axis:     Text Interpretation:         Laboratory Studies: Results for orders placed or performed during the hospital encounter of 11/25/22 (from the past 24 hour(s))  Pregnancy, urine     Status: None   Collection Time: 11/25/22  9:00 PM  Result Value Ref Range   Preg Test, Ur NEGATIVE NEGATIVE  Urinalysis, Routine w reflex microscopic -Urine, Clean Catch     Status: Abnormal   Collection Time: 11/25/22  9:00 PM  Result Value Ref Range   Color, Urine YELLOW YELLOW   APPearance HAZY (A) CLEAR   Specific Gravity, Urine 1.020 1.005 - 1.030   pH 5.0 5.0 - 8.0   Glucose, UA NEGATIVE NEGATIVE mg/dL   Hgb urine dipstick LARGE (A) NEGATIVE   Bilirubin Urine NEGATIVE NEGATIVE   Ketones, ur NEGATIVE NEGATIVE mg/dL   Protein, ur NEGATIVE NEGATIVE mg/dL   Nitrite NEGATIVE NEGATIVE    Leukocytes,Ua NEGATIVE NEGATIVE   RBC / HPF >50 0 - 5 RBC/hpf   WBC, UA 6-10 0 - 5 WBC/hpf   Bacteria, UA RARE (A) NONE SEEN   Squamous Epithelial / HPF 0-5 0 - 5 /HPF   Mucus PRESENT   Protime-INR     Status: None   Collection Time: 11/25/22  9:17 PM  Result Value Ref Range   Prothrombin Time 13.4 11.4 - 15.2 seconds   INR 1.0 0.8 - 1.2  APTT     Status: None   Collection Time: 11/25/22  9:17 PM  Result Value Ref Range   aPTT 26 24 - 36 seconds  CBC  Status: Abnormal   Collection Time: 11/25/22  9:17 PM  Result Value Ref Range   WBC 6.1 4.0 - 10.5 K/uL   RBC 3.87 3.87 - 5.11 MIL/uL   Hemoglobin 10.8 (L) 12.0 - 15.0 g/dL   HCT 78.2 (L) 95.6 - 21.3 %   MCV 89.7 80.0 - 100.0 fL   MCH 27.9 26.0 - 34.0 pg   MCHC 31.1 30.0 - 36.0 g/dL   RDW 08.6 57.8 - 46.9 %   Platelets 354 150 - 400 K/uL   nRBC 0.0 0.0 - 0.2 %  Differential     Status: None   Collection Time: 11/25/22  9:17 PM  Result Value Ref Range   Neutrophils Relative % 44 %   Neutro Abs 2.7 1.7 - 7.7 K/uL   Lymphocytes Relative 45 %   Lymphs Abs 2.7 0.7 - 4.0 K/uL   Monocytes Relative 9 %   Monocytes Absolute 0.6 0.1 - 1.0 K/uL   Eosinophils Relative 1 %   Eosinophils Absolute 0.1 0.0 - 0.5 K/uL   Basophils Relative 1 %   Basophils Absolute 0.0 0.0 - 0.1 K/uL   Immature Granulocytes 0 %   Abs Immature Granulocytes 0.01 0.00 - 0.07 K/uL  Comprehensive metabolic panel     Status: Abnormal   Collection Time: 11/25/22  9:17 PM  Result Value Ref Range   Sodium 138 135 - 145 mmol/L   Potassium 3.7 3.5 - 5.1 mmol/L   Chloride 108 98 - 111 mmol/L   CO2 24 22 - 32 mmol/L   Glucose, Bld 86 70 - 99 mg/dL   BUN 12 6 - 20 mg/dL   Creatinine, Ser 6.29 0.44 - 1.00 mg/dL   Calcium 8.6 (L) 8.9 - 10.3 mg/dL   Total Protein 6.7 6.5 - 8.1 g/dL   Albumin 3.1 (L) 3.5 - 5.0 g/dL   AST 16 15 - 41 U/L   ALT 11 0 - 44 U/L   Alkaline Phosphatase 61 38 - 126 U/L   Total Bilirubin 0.3 0.3 - 1.2 mg/dL   GFR, Estimated >52 >84  mL/min   Anion gap 6 5 - 15  Ethanol     Status: None   Collection Time: 11/25/22  9:17 PM  Result Value Ref Range   Alcohol, Ethyl (B) <10 <10 mg/dL  I-stat chem 8, ed     Status: Abnormal   Collection Time: 11/25/22  9:17 PM  Result Value Ref Range   Sodium 140 135 - 145 mmol/L   Potassium 3.6 3.5 - 5.1 mmol/L   Chloride 105 98 - 111 mmol/L   BUN 13 6 - 20 mg/dL   Creatinine, Ser 1.32 0.44 - 1.00 mg/dL   Glucose, Bld 86 70 - 99 mg/dL   Calcium, Ion 4.40 (L) 1.15 - 1.40 mmol/L   TCO2 26 22 - 32 mmol/L   Hemoglobin 11.6 (L) 12.0 - 15.0 g/dL   HCT 10.2 (L) 72.5 - 36.6 %   Imaging Studies: CT Head Wo Contrast  Result Date: 11/25/2022 CLINICAL DATA:  Headache. EXAM: CT HEAD WITHOUT CONTRAST TECHNIQUE: Contiguous axial images were obtained from the base of the skull through the vertex without intravenous contrast. RADIATION DOSE REDUCTION: This exam was performed according to the departmental dose-optimization program which includes automated exposure control, adjustment of the mA and/or kV according to patient size and/or use of iterative reconstruction technique. COMPARISON:  July 24, 2022 FINDINGS: Brain: No evidence of acute infarction, hemorrhage, hydrocephalus, extra-axial collection or mass lesion/mass effect.  Chronic left basal ganglia and left corona radiata infarcts are seen. Vascular: No hyperdense vessel or unexpected calcification. Skull: Normal. Negative for fracture or focal lesion. Sinuses/Orbits: No acute finding. Other: A partially empty sella is noted. IMPRESSION: 1. Chronic left basal ganglia and left corona radiata infarcts. 2. No acute intracranial abnormality. 3. Partially empty sella. Electronically Signed   By: Aram Candela M.D.   On: 11/25/2022 23:39    ED COURSE and MDM  Nursing notes, initial and subsequent vitals signs, including pulse oximetry, reviewed and interpreted by myself.  Vitals:   11/26/22 0318 11/26/22 0345 11/26/22 0418 11/26/22 0435  BP:   105/82 (!) 90/45 98/75  Pulse:  69 (!) 56 71  Resp:  18 17 16   Temp: 99 F (37.2 C)     TempSrc: Oral     SpO2:  91% 100% 100%  Weight:      Height:       Medications  sodium chloride 0.9 % bolus 1,000 mL (1,000 mLs Intravenous New Bag/Given 11/26/22 0329)  diphenhydrAMINE (BENADRYL) injection 25 mg (25 mg Intravenous Given 11/26/22 0330)  metoCLOPramide (REGLAN) injection 10 mg (10 mg Intravenous Given 11/26/22 0332)  ketorolac (TORADOL) 15 MG/ML injection 15 mg (15 mg Intravenous Given 11/26/22 0331)   5:01 AM Pain relieved with IV medications and fluids.  Patient ready to go home.  I suspect this is one of her migraines.  PROCEDURES  Procedures   ED DIAGNOSES     ICD-10-CM   1. Migraine without aura and without status migrainosus, not intractable  G43.009          Sharronda Schweers, Jonny Ruiz, MD 11/26/22 [phone]

## 2022-11-26 NOTE — ED Triage Notes (Signed)
C/o HA since Friday that she says has been consistent. Currently she rates pain at 4/10, describes it as a dull squeezing pain at the crown of her head. Denies dizziness, n/v, light sensitivity or other sx. States she usually takes tramadol but it is not relieving her HA this time. She has had two doses since Friday. Last dose MN. She also took Zonisamide for the first time tonight. She states this was prescribed to her by the neurologist to help with her frequent HA.

## 2023-08-10 ENCOUNTER — Other Ambulatory Visit: Payer: Self-pay

## 2023-08-10 ENCOUNTER — Emergency Department (HOSPITAL_COMMUNITY)
Admission: EM | Admit: 2023-08-10 | Discharge: 2023-08-10 | Disposition: A | Discharge status: Home / Self Care | Attending: Emergency Medicine | Admitting: Emergency Medicine

## 2023-08-10 ENCOUNTER — Emergency Department (HOSPITAL_COMMUNITY)

## 2023-08-10 ENCOUNTER — Encounter (HOSPITAL_COMMUNITY): Payer: Self-pay

## 2023-08-10 DIAGNOSIS — Z8673 Personal history of transient ischemic attack (TIA), and cerebral infarction without residual deficits: Secondary | ICD-10-CM | POA: Diagnosis not present

## 2023-08-10 DIAGNOSIS — G510 Bell's palsy: Secondary | ICD-10-CM | POA: Diagnosis not present

## 2023-08-10 DIAGNOSIS — Z7902 Long term (current) use of antithrombotics/antiplatelets: Secondary | ICD-10-CM | POA: Diagnosis not present

## 2023-08-10 DIAGNOSIS — R519 Headache, unspecified: Secondary | ICD-10-CM | POA: Insufficient documentation

## 2023-08-10 DIAGNOSIS — R202 Paresthesia of skin: Secondary | ICD-10-CM | POA: Diagnosis present

## 2023-08-10 LAB — CBC
HCT: 36.8 % (ref 36.0–46.0)
Hemoglobin: 11 g/dL — ABNORMAL LOW (ref 12.0–15.0)
MCH: 25.2 pg — ABNORMAL LOW (ref 26.0–34.0)
MCHC: 29.9 g/dL — ABNORMAL LOW (ref 30.0–36.0)
MCV: 84.4 fL (ref 80.0–100.0)
Platelets: 292 10*3/uL (ref 150–400)
RBC: 4.36 MIL/uL (ref 3.87–5.11)
RDW: 15.9 % — ABNORMAL HIGH (ref 11.5–15.5)
WBC: 5.2 10*3/uL (ref 4.0–10.5)
nRBC: 0 % (ref 0.0–0.2)

## 2023-08-10 LAB — BASIC METABOLIC PANEL
Anion gap: 8 (ref 5–15)
BUN: 17 mg/dL (ref 6–20)
CO2: 26 mmol/L (ref 22–32)
Calcium: 9.3 mg/dL (ref 8.9–10.3)
Chloride: 106 mmol/L (ref 98–111)
Creatinine, Ser: 0.73 mg/dL (ref 0.44–1.00)
GFR, Estimated: >60 mL/min (ref >60)
Glucose, Bld: 90 mg/dL (ref 70–99)
Potassium: 4.6 mmol/L (ref 3.5–5.1)
Sodium: 140 mmol/L (ref 135–145)

## 2023-08-10 MED ORDER — PREDNISONE 10 MG PO TABS: ORAL_TABLET | ORAL | 0 refills | Status: AC

## 2023-08-10 MED ORDER — PREDNISONE 20 MG PO TABS
60.0000 mg | ORAL_TABLET | Freq: Once | ORAL | Status: AC
  Administered 2023-08-10: 60 mg via ORAL
  Filled 2023-08-10: qty 3

## 2023-08-10 NOTE — Discharge Instructions (Addendum)
 For [name] you have been given prescribed a 10-day taper course of prednisone steroids.  Please take these as directed on the package.  Your next dose is due tomorrow morning on March 8 with breakfast.  If you are having difficulty closing her left eye, you should buy artificial tears to use in the daytime to keep it lubricated, and gently tape your eye shut at night.  Most people recover from Bell's palsy within 1 month.  But if you are having persistent weakness or symptoms you will need to follow-up with a neurologist.

## 2023-08-10 NOTE — ED Notes (Signed)
 Patient transported to CT

## 2023-08-10 NOTE — ED Provider Notes (Signed)
 [place] EMERGENCY DEPARTMENT AT [facility] Provider Note   CSN: [id number] Arrival date & time: 08/10/23  0825     History  Chief Complaint  Patient presents with   Facial Droop    Megan Hale is a 48 y.o. female w/ hx of obesity, prior stroke on plavix, high cholesterol, presenting to the ED with left-sided facial paresthesias and weakness.  Patient reports that she had migraine injections performed around her skull 4 days ago, reporting that "they must of stuck me 60 times".  She said that she was not having a significant headache at the time but began to develop a headache afterwards, and was feeling tingling on the side of her face.  Yesterday she took a nap with her face down on the desk.  This morning when she woke up she noted that half of her face felt different and she felt like her eye muscles were weak and she was having a hard time closing her left eye.  She reports she has a mild headache.  Denies numbness or weakness of the arms or legs.  She reports that in the past, about 12 years ago she had a stroke which resulted in some transient weakness in her right extremities, hand and leg, but those have since resolved.  She does not currently have the symptoms.  HPI     Home Medications Prior to Admission medications   Medication Sig Start Date End Date Taking? Authorizing Provider  predniSONE (DELTASONE) 10 MG tablet Take 6 tablets (60 mg total) by mouth daily with breakfast for 4 days, THEN 4 tablets (40 mg total) daily with breakfast for 3 days, THEN 2 tablets (20 mg total) daily with breakfast for 2 days. 08/11/23 08/20/23 Yes Emelee Rodocker, Kermit Balo, MD  atorvastatin (LIPITOR) 40 MG tablet Take 40 mg by mouth every evening. 05/27/20   [provider]  buPROPion (WELLBUTRIN XL) 300 MG 24 hr tablet Take 300 mg by mouth in the morning. 06/29/20   [provider]  Cholecalciferol (VITAMIN D-3) 125 MCG (5000 UT) TABS Take 5,000 Units by mouth 3 (three)  times a week.    [provider]  citalopram (CELEXA) 10 MG tablet Take 10 mg by mouth in the morning. 06/29/20   [provider]  clopidogrel (PLAVIX) 75 MG tablet Take 75 mg by mouth in the morning. 05/27/20   [provider]  fluticasone (FLONASE) 50 MCG/ACT nasal spray Place 1 spray into both nostrils See admin instructions. Instill 1 spray into each nostril (scheduled) every morning & may instill an additional dose in the evening if needed for allergies.    [provider]  fluticasone-salmeterol (ADVAIR HFA) 115-21 MCG/ACT inhaler Inhale 2 puffs into the lungs 2 (two) times daily.    [provider]  lisinopril (ZESTRIL) 10 MG tablet Take 10 mg by mouth in the morning and at bedtime. Morning & night    [provider]  loratadine (CLARITIN) 10 MG tablet Take 10 mg by mouth in the morning. AllerClear    [provider]  methocarbamol (ROBAXIN) 750 MG tablet Take 750 mg by mouth daily as needed for muscle spasms.    [provider]  montelukast (SINGULAIR) 10 MG tablet Take 10 mg by mouth in the morning.    [provider]  Multiple Vitamin (MULTIVITAMIN WITH MINERALS) TABS tablet Take 1 tablet by mouth in the morning.    [provider]  traMADol (ULTRAM) 50 MG tablet Take 50 mg  by mouth every 6 (six) hours as needed.    [provider]  zonisamide (ZONEGRAN) 25 MG capsule Take 25 mg by mouth daily.    [provider]      Allergies    Patient has no known allergies.    Review of Systems   Review of Systems  Physical Exam Updated Vital Signs BP (!) 150/89   Pulse 66   Temp 97.9 F (36.6 C)   Resp 13   Ht 5\' 6"  (1.676 m)   Wt 131.5 kg   SpO2 100%   BMI 46.81 kg/m  Physical Exam Constitutional:      General: She is not in acute distress.    Appearance: She is obese.  HENT:     Head: Normocephalic and atraumatic.  Eyes:     Conjunctiva/sclera: Conjunctivae normal.      Pupils: Pupils are equal, round, and reactive to light.  Cardiovascular:     Rate and Rhythm: Normal rate and regular rhythm.  Pulmonary:     Effort: Pulmonary effort is normal. No respiratory distress.  Abdominal:     General: There is no distension.     Tenderness: There is no abdominal tenderness.  Skin:    General: Skin is warm and dry.  Neurological:     Mental Status: She is alert.     Comments: Hemiparesis of left sided facial muscles on exam including forehead an extraocular muscles; patient is able to close her eye; speech is clear; PERRL; asymmetric smile; no other CN deficits noted; remainder of neuro exam including motor/sensory exam normal     ED Results / Procedures / Treatments   Labs (all labs ordered are listed, but only abnormal results are displayed) Labs Reviewed  CBC - Abnormal; Notable for the following components:      Result Value   Hemoglobin 11.0 (*)    MCH 25.2 (*)    MCHC 29.9 (*)    RDW 15.9 (*)    All other components within normal limits  BASIC METABOLIC PANEL    EKG None  Radiology CT Head Wo Contrast Result Date: 08/10/2023 CLINICAL DATA:  48 year old female with headache, facial droop. EXAM: CT HEAD WITHOUT CONTRAST TECHNIQUE: Contiguous axial images were obtained from the base of the skull through the vertex without intravenous contrast. RADIATION DOSE REDUCTION: This exam was performed according to the departmental dose-optimization program which includes automated exposure control, adjustment of the mA and/or kV according to patient size and/or use of iterative reconstruction technique. COMPARISON:  Brain MRI 08/11/2022.  Head CT 11/25/2022. FINDINGS: Brain: Cavum septum pellucidum, normal variant. Stable cerebral volume. Chronic lacunar infarct left corona radiata and lentiform, stable. Chronic basal ganglia vascular calcifications. No midline shift, ventriculomegaly, mass effect, evidence of mass lesion, intracranial hemorrhage or evidence of  cortically based acute infarction. Stable gray-white matter differentiation throughout the brain. Chronic partially empty sella. Vascular: No suspicious intracranial vascular hyperdensity. Calcified atherosclerosis at the skull base. Skull: Stable and intact. Sinuses/Orbits: Visualized paranasal sinuses and mastoids are stable and well aerated. Other: Visualized orbits and scalp soft tissues are within normal limits. IMPRESSION: 1. No acute intracranial abnormality. 2. Stable non contrast CT appearance of chronic lacunar infarct left corona radiata/lentiform. Electronically Signed   By: Odessa Fleming M.D.   On: 08/10/2023 10:54    Procedures Procedures    Medications Ordered in ED Medications  predniSONE (DELTASONE) tablet 60 mg (has no administration in time range)    ED Course/ Medical Decision Making/ A&P  Medical Decision Making Amount and/or Complexity of Data Reviewed Labs: ordered. Radiology: ordered.  Risk Prescription drug management.   Patient is here tearful complaining of mild headache and now left-sided facial paresthesias and weakness.  Clinically I suspect this is most likely consistent with Bell's palsy.  Trigger for this is unclear, may be idiopathic; patient is concerned that her "migraine" injections may have caused this, which seems less likely, but it's not clear if she had injections near the facial nerve transit point.  She does have a mild persistent headache - reasonable to obtain CT imaging for screening, and basic labs to check electrolytes and glucose levels.  No evidence of LVO.  Doubt SAH, meningitis, CVA at this time.  No indication for LP.  Reviewed external records - MRI brain 08/11/22 showing remote lacunar infarcts in the right pons and left basal ganglia and corona radiata.  I personally reviewed and interpreted the patient's labs and imaging today, with no emergent findings.  I counseled the patient about expectations  regarding Bell's palsy.  At this point I do not see evidence of viral infection or herpes zoster to indicate initiation of Valtrex.  The patient is not diabetic.  I think she would be a good candidate for prednisone given the early onset of her symptoms.  We will put her on a 10-day taper.  60 mg given in the ED.  She has a neurologist to follow-up with already.  We discussed return precautions and information was provided.  She is stable for discharge at this time and has no further questions            Final Clinical Impression(s) / ED Diagnoses Final diagnoses:  Bell's palsy    Rx / DC Orders ED Discharge Orders          Ordered    predniSONE (DELTASONE) 10 MG tablet  Q breakfast        08/10/23 1108              Terald Sleeper, MD 08/10/23 1110

## 2023-08-10 NOTE — ED Triage Notes (Signed)
 Pt here for right sided facial droop. Pt states yesterday her face felt tight. Pt is on plavix for past stroke. Pt had migraine tx on Wednesday. Axox4. LSN 2 days ago

## 2023-11-09 NOTE — Progress Notes (Signed)
 [facility] - BARIATRIC PREOPERATIVE SURGERY NOTE   Primary Care Provider: No primary care provider on file.   Reason For Visit: Chronic Severe Obesity follow-up after medical weight loss program, preoperative bariatric surgery  History of Present Illness Megan Hale is a 48 y.o. female , referred by Duwayne RIGGERS, who comes today for bariatric surgery re-evaluation and scheduling of surgery for chronic severe obesity. She has previously tried and failed multiple weight loss programs including dietary modification, exercise and pharmacologic intervention without any sustained, significant or successful weight loss.  We saw the patient, deemed them a candidate for bariatric surgery and placed them in the bariatric program, with evaluations from exercise, nutrition and psychiatric teams.  They have all deemed the patient an excellent candidate for bariatric surgery and have cleared them.  Patient is now here for re-evaluation, preoperative discussion of surgery and scheduling.  Patient has undergone upper GI, which revealed hiatal hernia, history of sleeve gastrectomy, normal duodenum.  We discussed findings.  The patient is on anticoagulation or antiplatelet medications, Plavix, which was stopped 5 to 7 days prior to surgery and will restart after.  History of TIA and stroke, on Plavix .  Postoperative VTE risk as calculated by [facility] clinic VTE risk calculator is greater than 0.4% thus we will start Plavix back immediately after surgery, typically within 12 to 24 hours.  Value greater than 0.4% constitutes postoperative VTE prophylaxis.       Past Medical History Past Medical History:  Diagnosis Date  . OSA (obstructive sleep apnea)    NOT USING CPAP THERAPY AS PRESCRIBED  . Stroke (*)    2013  . TIA (transient ischemic attack) 07/20/2022    Past Surgical History Past Surgical History:  Procedure Laterality Date  . Sleeve gastrectomy  2017    Family  History History reviewed. No pertinent family history.  Social History Social History   Tobacco Use  Smoking Status Never  Smokeless Tobacco Never   Social History   Substance and Sexual Activity  Alcohol Use Not Currently   Social History   Substance and Sexual Activity  Drug Use Never    Medications Outpatient Medications Marked as Taking for the 11/09/23 encounter (Office Visit) with Bethann Nettles, MD  Medication Sig Dispense Refill  . aspirin  (ECOTRIN LOW DOSE) EC tablet Take one tablet (81 mg dose) by mouth.    . atorvastatin  (LIPITOR) 40 mg tablet Take one tablet (40 mg dose) by mouth at bedtime. QHS    . baclofen (LIORESAL) 10 mg tablet Take one tablet (10 mg dose) by mouth 2 (two) times a day as needed (PRN ONLY).    . buPROPion  hcl (WELLBUTRIN  XL) 300 mg 24 hr tablet Take one tablet (300 mg dose) by mouth every morning. QAM    . Cholecalciferol 125 MCG (5000 UT) TABS Take 5,000 Units by mouth 3 (three) times a week.    . citalopram hydrobromide (CELEXA) 20 mg tablet Take one tablet (20 mg dose) by mouth every morning. QAM    . clopidogrel bisulfate (PLAVIX) 75 mg tablet Take one tablet (75 mg dose) by mouth every morning. QAM    . cyclobenzaprine (FLEXERIL) 5 mg tablet Take one tablet (5 mg dose) by mouth with meals as needed.    . ergocalciferol (VITAMIN D2) 50,000 units CAPS capsule Take one capsule (50,000 Units dose) by mouth once a week at 0900. WEEKLY ON THURSDAYS    . ferrous sulfate 325 (65 FE) MG tablet Take one tablet (325  mg dose) by mouth 2 (two) times daily.    . fluticasone propionate (FLONASE) 50 mcg/actuation nasal spray one spray by Nasal route 2 (two) times a day as needed for Rhinitis or Allergies (PRN ONLY).    . fluticasone-salmeterol (ADVAIR HFA) 115-21 MCG/ACT inhaler Inhale two puffs into the lungs 2 (two) times daily.    SABRA lisinopril (PRINIVIL,ZESTRIL) 10 mg tablet Take one tablet (10 mg dose) by mouth 2 (two) times daily.    SABRA loratadine  (CLARITIN) 10 MG tablet Take one tablet (10 mg dose) by mouth every morning. QAM    . meloxicam (MOBIC) 7.5 mg tablet Take one tablet (7.5 mg dose) by mouth daily.    . methocarbamol (ROBAXIN) 750 mg tablet Take one tablet (750 mg dose) by mouth daily as needed.    . montelukast (SINGULAIR) 10 MG tablet Take one tablet (10 mg dose) by mouth.    . Multiple Vitamins-Minerals (MULTI VITAMIN/MINERALS) TABS Take 1 tablet by mouth.    . olmesartan (BENICAR) 20 MG tablet Take one tablet (20 mg dose) by mouth daily.    . Olmesartan-amLODIPine-HCTZ 20-5-12.5 MG TABS per tablet Take one tablet by mouth every morning. QAM    . predniSONE  10 MG (21) TBPK Take by mouth see administration instructions.    . promethazine-dextromethorphan (PROMETHAZINE-DM) 6.25-15 MG/5ML syrup Take 5 mLs by mouth every 4 (four) hours as needed.    . traMADol (ULTRAM) 50 mg tablet Take one tablet (50 mg dose) by mouth every 6 (six) hours as needed.    . triamcinolone (ARISTOCORT,KENALOG) 0.5% cream Apply topically 2 (two) times daily.    . Zonisamide (ZONISADE PO) Take 100 mg by mouth at bedtime. QHS       Allergies No Known Allergies  Review of Systems Constitutional: Negative for chills, fatigue and fever.  HENT: Negative for congestion, ear pain, sinus pain and sore throat.   Eyes: Negative for pain.  Respiratory: Negative for shortness of breath, wheezing and stridor.   Cardiovascular: Negative for chest pain, palpitations and leg swelling.  Gastrointestinal: Negative for nausea, vomiting, diarrhea, constipation, abdominal pain. Endocrine: Negative for cold intolerance, heat intolerance and polydipsia.  Genitourinary: Negative for difficulty urinating, dysuria and flank pain.  Musculoskeletal: Negative for arthralgias, back pain and neck pain.  Skin: Negative for pallor, rash and wound.  Allergic/Immunologic: Negative for environmental allergies, food allergies and immunocompromised state.  Neurological: Negative for  dizziness, light-headedness and numbness.  Hematological: Negative for adenopathy. Does not bruise/bleed easily.  Psychiatric/Behavioral: Negative for agitation, confusion, hallucinations and suicidal ideas.   Vital Signs BP 125/84 (BP Location: Left Upper Arm, Patient Position: Sitting)   Pulse 88   Temp 98.4 F (36.9 C) (Temporal)   Ht 5' 6 (1.676 m)   Wt 295 lb 12.8 oz (134.2 kg)   BMI 47.74 kg/m  Body mass index is 47.74 kg/m.  Ht Readings from Last 1 Encounters:  11/09/23 5' 6 (1.676 m)    Wt Readings from Last 1 Encounters:  11/09/23 295 lb 12.8 oz (134.2 kg)     Physical Exam General: Alert, in no acute distress, resting comfortably in chair  HEENT:  Normocephalic and atraumatic. Extraocular movements intact. Conjunctivae normal.  Cardiovascular: Normal rate and regular rhythm. Normal pulses. Normal heart sounds.  Pulmonary: Normal effort and breath sounds  Abdominal: Soft, flat, non distended, non tender. No guarding or rebound tenderness.  Morbidly obese.  Well-healed incisions Skin: warm and dry Musculoskeletal:  Normal strength and range of motion. Neurological: No  focal deficit present. she is alert and oriented to person, place, and time.  Psychiatric: Mood normal. Behavior normal. Thought content normal.  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 female with chronic severe obesity interested in metabolic and bariatric surgery.  They have been seen and cleared by exercise, nutrition and psychiatric teams.  They have been deemed a good candidate, have shown good bariatric compliance, now ready for bariatric surgery.  We discussed robotic approach, robotic stapling/suturing, the procedure of choice for the patient which is robotic conversion  of her sleeve to Roux-en-Y gastric bypass, risk and benefits, 1-2 night stay, diet progression, post operative nutrition and exercise expectations.  All questions answered.   Patient understands, agrees and wants to proceed with surgery.  Proceed with robotic conversion of sleeve to Roux-en-Y gastric bypass. We will schedule today.  Liver shrinking diet to commence 2 weeks prior to surgical date.  Will hold blood thinners, steroids and other immunosuppressants, as well as weight loss medications including GLP-1 inhibitors appropriately based on surgery date.  Any and all of these can be restarted after surgery and we discussed this in particular as it applies to this patient.  Bethann Nettles, MD 11/12/2023 11:52 AM

## 2023-11-28 HISTORY — PX: ROUX-EN-Y GASTRIC BYPASS: SHX1104

## 2023-11-29 NOTE — Discharge Summary (Signed)
 [facility]  [facility] Inpatient Discharge Summary  PCP: Evalene CHRISTELLA Keel, FNP Discharge Details   Admit date:         11/28/2023 Discharge date:        11/29/2023  Hospital Days:    1 days  Code Status:   Full Code Advanced Directives on file: No Directive        Discharge Diagnoses:  Principal Problem:   Morbid obesity due to excess calories (*) Active Problems:   Sleep apnea   Class 3 severe obesity due to excess calories with body mass index (BMI) of 40.0 to 44.9 in adult   TIA (transient ischemic attack)   Essential hypertension   Stroke (*)   Morbid obesity (*)    Follow-Up Appointments Suggested: No follow-up provider specified. Follow-Up Appointments Already Scheduled: Future Appointments  Date Time Provider Department Center  12/06/2023  4:00 PM Bethann Nettles, MD [facility] None  12/14/2023  1:00 PM Tereasa LOISE Ege, RD [facility] None  12/19/2023  3:40 PM Donnice Rockey Dollar, PA-C [facility] None  01/02/2024 11:00 AM Bethann Nettles, MD [facility] None  01/08/2024  3:30 PM Tereasa LOISE Ege, RD [facility] None  01/23/2024  4:00 PM Aleck Sor, LCSW [facility] None  03/04/2024  2:40 PM Donnice Rockey Dollar, PA-C [facility] None  05/22/2024  4:00 PM Bethann Nettles, MD [facility] None  05/26/2024  3:30 PM Tereasa LOISE Ege, RD [facility] None    Discharge Medications:   Current Discharge Medication List     START taking these medications      Details  acetaminophen  500 mg tablet Commonly known as: TYLENOL   Take two tablets (1,000 mg dose) by mouth 3 (three) times a day for 4 days. Quantity: 24 tablet   hyoscyamine sulfate 0.125 mg SL tablet Commonly known as: LEVSIN/SL  Place one tablet (0.125 mg dose) under the tongue every 6 (six) hours as needed (gas pains/bloating). Indication: gas pains Quantity: 56 tablet   methocarbamol 500 mg tablet Commonly known as: ROBAXIN  Take one tablet (500 mg dose) by mouth 3 (three) times a day for 4 days. Quantity: 12  tablet   omeprazole 40 mg capsule Commonly known as: PRILOSEC  Take one capsule (40 mg dose) by mouth daily. Quantity: 30 capsule   ondansetron  4 mg tablet Commonly known as: ZOFRAN   Take one tablet (4 mg dose) by mouth every 8 (eight) hours as needed for Nausea for up to 7 days. Indication: Nausea and Vomiting Following an Operation Quantity: 21 tablet   oxyCODONE HCl 5 mg immediate release tablet Commonly known as: ROXICODONE  Take one tablet (5 mg dose) by mouth every 8 (eight) hours as needed for Pain (take 1 tablet for moderate pain and 2 for severe) for up to 4 days. Max Daily Amount: 15 mg Quantity: 12 tablet       CONTINUE these medications which have NOT CHANGED      Details  aspirin  EC tablet Commonly known as: ECOTRIN LOW DOSE  Take one tablet (81 mg dose) by mouth every morning.   atorvastatin  40 mg tablet Commonly known as: LIPITOR  Take one tablet (40 mg dose) by mouth at bedtime. QHS   baclofen 10 mg tablet Commonly known as: LIORESAL  Take one tablet (10 mg dose) by mouth 2 (two) times a day as needed (PRN ONLY).   buPROPion  hcl 300 mg 24 hr tablet Commonly known as: WELLBUTRIN  XL  Take one tablet (300 mg dose) by mouth  every morning. QAM   Cholecalciferol 125 MCG (5000 UT) Tabs  Take 5,000 Units by mouth 3 (three) times a week. M-W-F   citalopram hydrobromide 20 mg tablet Commonly known as: CELEXA  Take one tablet (20 mg dose) by mouth every morning. QAM   clopidogrel bisulfate 75 mg tablet Commonly known as: PLAVIX  Take one tablet (75 mg dose) by mouth every morning. QAM   ergocalciferol 50,000 units Caps capsule Commonly known as: Vitamin D2  Take one capsule (50,000 Units dose) by mouth once a week at 0900. WEEKLY ON THURSDAYS last dose 11/08/23   fluticasone propionate 50 mcg/actuation nasal spray Commonly known as: FLONASE  one spray by Nasal route 2 (two) times a day as needed for Rhinitis or Allergies (PRN ONLY).   olmesartan 20 MG  tablet Commonly known as: BENICAR  Take one tablet (20 mg dose) by mouth every morning.   promethazine-dextromethorphan 6.25-15 MG/5ML syrup Commonly known as: PROMETHAZINE-DM  Take 5 mLs by mouth every 4 (four) hours as needed.   scopolamine 1 MG/3DAYS patch Commonly known as: TRANSDERM-SCOP 1.5 MG  Place one patch onto skin at 8:00pm the night before surgery. Quantity: 1 patch   triamcinolone 0.5% cream Commonly known as: ARISTOCORT,KENALOG  Apply one application. topically as needed.   ZONISADE PO  Take 100 mg by mouth at bedtime. QHS      * You might also be taking other medications not listed above. If you have questions about any of your other medications, talk to the person who prescribed them or your Primary Care Provider.          STOP taking these medications    Multi Vitamin/Minerals Tabs        Allergies: Allergies[1]  Consultations this Admission: BARIATRIC NURSE NAVIGATOR CONSULT IP CONSULT TO NUTRITION SERVICES  Procedures/Imaging: Procedure(s) (LRB): ERAS ROBOTIC-ASSISTED LAPAROSCOPIC ROUX-EN-Y GASTRIC BYPASS, HIATAL HERNIA REPAIR (N/A)    No orders to display    Pertinent Labs:  Cardiac Labs: No results for input(s): CK, CKMB, CTNI, BNP in the last 168 hours. CBC: Recent Labs    Units 11/28/23 1340  WBC thou/mcL 9.5  HGB gm/dL 88.4  PLT thou/mcL 708   BMP: Recent Labs    Units 11/28/23 1340  NA mmol/L 136  K mmol/L 4.1  CL mmol/L 101  CO2 mmol/L 23  BUN mg/dL 18  CREATININE mg/dL 9.20   Lipid Panel: No results for input(s): CHOL, TRIG, HDL, LDL in the last 168 hours. Liver Enzymes: No results for input(s): INR, AST, ALT, ALKPHOS, BILITOT in the last 168 hours. Endocrine Panels: Recent Labs    Units 11/28/23 1340 11/28/23 0632  HGBA1C % 5.6  --   GLUCOSE mg/dL [facility] Course   Physicians involved in care during this hospitalization Attending Provider: Bethann Nettles,  MD Admitting Provider: Bethann Nettles, MD Anesthesiologist: Kieth Norleen Delma DOUGLAS, MD   Hospital Course:    Megan Hale is a 48 y.o. morbidly obese female who was admitted to the General Surgery service on 11/28/23 for the above stated procedure with Dr. Nettles.  There were no intra-operative complications, and the patient did well overall from a surgical standpoint. She was kept on DVT and GI prophylaxis for the length of admission, and frequent ambulation was encouraged.  She was started on the bariatric clear liquid diet post-operatively, then advanced to the bariatric full liquid diet the following morning.  On the day of discharge, the patient reports doing well: pain is  adequately controlled, denies nausea, vomiting, fever or chills.  Tolerating PO intake and ambulating well.  She feels ready to go home today.  On physical exam, the patient's abdomen is soft, non-distended, appropriately tender to palpation, and incisions are clean, dry, and intact without evidence of infection.   Post-op instructions were discussed. We will plan for discharge today with follow-up in 1 week for post-op visit as scheduled.    BP 137/81 (BP Location: Left Upper Arm, Patient Position: Sitting)   Pulse 73   Temp 98.5 F (36.9 C) (Oral)   Resp 18   Ht 5' 6 (1.676 m)   Wt 286 lb 14.4 oz (130.1 kg)   SpO2 97%   BMI 46.31 kg/m     Post Hospital Care   Activity: Activity Instructions     Activity as tolerated     Driving Restrictions     No driving while taking narcotic pain medication.       Weight Bearing Status:          Oxygen Orders for Discharge: O2 Device: None (Room air) SpO2: 97 %  Diet: Diet and Nourishment Orders (From admission, onward)     Start       11/29/23 0800  Dietary nutrition supplements Ensure Max Protein  With Meals       Question:  Select Supplement  Answer:  Ensure Max Protein     11/29/23 0600  Bariatric Full Liquid Diet OTHER (SEE COMMENTS)  (ERAS  Diet)  Diet effective now       Comments: 120 m/hr (4 oz/hr or 1/2 cup/hr) bariatric full liquid diet including protein shakes beginning at 0600  Question:  Fluid restriction:  Answer:  OTHER (SEE COMMENTS)              I spent 25 minutes performing discharge services.   Electronically signed: Laymon FORBES Gentry, PA-C 11/29/2023 / 8:12 AM       [1] No Known Allergies *Some images could not be shown.

## 2023-12-10 NOTE — Progress Notes (Signed)
 Bariatric Surgery 1-week Postoperative Visit Note  Subjective Megan Hale is a 48 y.o. 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  She states she is struggling with getting fluids and and we discussed this.  Also she does not like protein shakes, has a lot of issues with different p.o liquids.  We discussed extensively to get at least 3 G2 Gatorade's and as well as at least 1-2 protein shakes.  Discussed with her mother who presents with her and we discussed strategies.  Otherwise ambulating some, decreased energy but trying her best.  Also asking about return to work which I hesitate given her weakness, I think she needs to get more fluids and protein shakes and and then she can return when she is feeling more up to it.  She is doing well, incisional pain is better controlled as of recent.  She complains about the late right-sided staple port site which we closed with a suture and I discussed this with her.  Still awaiting bowel function, we discussed MiraLAX  daily until bowel movement.    She is not walking 60 minutes a day we discussed getting to this level.  She is taking omeprazole as prescribed.  Objective Temperature 97.4 F (36.3 C), temperature source Temporal, height 5' 6.5 (1.689 m), weight 271 lb 9.6 oz (123.2 kg).  DOS weight: Preop note weight 295 pounds, today she is 271 pounds, 1 week postop Abdomen: Soft, non tender, non distended, Incisions are clean, dry and intact, healing well, no surrounding erythema  Final pathology: No results found for: FINALDX   Assessment and Plan 1 week s/p robotic conversion of sleeve gastrectomy to Roux-en-Y gastric bypass, struggling to get in enough adequate fluids and protein shakes.  - We discussed goal of drinking 64 ounces of fluids per day, 60 g of protein per day - We discussed goal of walking 60 minutes/day - Continue omeprazole x6 months as  prescribed - Start ursodiol for 6 months to prevent gallstones  - Start multivitamin, calcium  and vitamin D - Follow-up with registered dietitian in 1 week - Advance to pured diet in 1 week - Follow-up with Megan Dollar, PA in 2 weeks - Return to clinic in 4 weeks - No heavy lifting for 4 weeks after surgery

## 2023-12-16 NOTE — Progress Notes (Signed)
 Bariatric Surgery additional 2-week Postoperative Visit Note  Subjective Megan Hale is a 48 y.o. female presenting for follow up 2 weeks after I performed robotic conversion of sleeve gastrectomy to Roux-en-Y gastric bypass.    When I saw her last week, she was struggling with getting fluids in, still had not had a bowel movement, not getting in enough fluids or protein shakes.  Also, thought she was having some hypoglycemic episodes.  We discussed adding in some Gatorade light, getting in more fluids, pushing protein shakes.  Additionally, another issue I felt was no bowel movements.  Thus I asked that she return after 1 week of pushing the p.o. intake as well as suppositories, enemas and MiraLAX .    Today she returns, feeling much better.  Having bowel movements as of yesterday.  Getting more p.o. intake, now 40 ounces of p.o. fluids as well as at least 1 if not 2 protein shakes.  Overall much better.    Her mother is also here today and reports patient doing and feeling much better.  Asking to go back to work, I think this is reasonable for next week, now that she is doing better.  She is taking omeprazole as prescribed.  Objective Temperature 97.3 F (36.3 C), temperature source Temporal, height 5' 6.5 (1.689 m), weight 263 lb 6.4 oz (119.5 kg).  DOS weight: Preop note weight 295 pounds, today she is 263 pounds, 2 weeks postop Abdomen: Soft, non tender, non distended, Incisions are clean, dry and intact, healing well, no surrounding erythema  Final pathology: No results found for: FINALDX   Assessment and Plan 1 week s/p robotic conversion of sleeve gastrectomy to Roux-en-Y gastric bypass, much improved, p.o. intake improved, having bowel movements, back on track  - We discussed continued goal of drinking 64 ounces of fluids per day, 60 g of protein per day - We discussed continued goal of walking 60 minutes/day - Continue bowel regiment -Follow-up with Adina Dollar, PA-C  next week for her normal 3-week evaluation, and she will see me back at 5 weeks postop - No heavy lifting for 4 weeks after surgery

## 2023-12-22 ENCOUNTER — Emergency Department (HOSPITAL_COMMUNITY)

## 2023-12-22 ENCOUNTER — Inpatient Hospital Stay (HOSPITAL_COMMUNITY)

## 2023-12-22 ENCOUNTER — Inpatient Hospital Stay (HOSPITAL_COMMUNITY)
Admission: EM | Admit: 2023-12-22 | Discharge: 2023-12-23 | DRG: 393 | Discharge status: Other Acute Inpatient Hospital | Attending: Pulmonary Disease | Admitting: Pulmonary Disease

## 2023-12-22 ENCOUNTER — Other Ambulatory Visit (HOSPITAL_COMMUNITY)

## 2023-12-22 ENCOUNTER — Other Ambulatory Visit: Payer: Self-pay

## 2023-12-22 ENCOUNTER — Encounter (HOSPITAL_COMMUNITY): Payer: Self-pay | Admitting: Emergency Medicine

## 2023-12-22 DIAGNOSIS — Z7902 Long term (current) use of antithrombotics/antiplatelets: Secondary | ICD-10-CM

## 2023-12-22 DIAGNOSIS — R579 Shock, unspecified: Secondary | ICD-10-CM | POA: Diagnosis present

## 2023-12-22 DIAGNOSIS — K9131 Postprocedural partial intestinal obstruction: Secondary | ICD-10-CM | POA: Diagnosis present

## 2023-12-22 DIAGNOSIS — Z8249 Family history of ischemic heart disease and other diseases of the circulatory system: Secondary | ICD-10-CM | POA: Diagnosis not present

## 2023-12-22 DIAGNOSIS — E861 Hypovolemia: Secondary | ICD-10-CM | POA: Diagnosis present

## 2023-12-22 DIAGNOSIS — G4733 Obstructive sleep apnea (adult) (pediatric): Secondary | ICD-10-CM | POA: Diagnosis present

## 2023-12-22 DIAGNOSIS — Z833 Family history of diabetes mellitus: Secondary | ICD-10-CM | POA: Diagnosis not present

## 2023-12-22 DIAGNOSIS — Z9884 Bariatric surgery status: Secondary | ICD-10-CM | POA: Diagnosis not present

## 2023-12-22 DIAGNOSIS — E559 Vitamin D deficiency, unspecified: Secondary | ICD-10-CM | POA: Diagnosis present

## 2023-12-22 DIAGNOSIS — K922 Gastrointestinal hemorrhage, unspecified: Secondary | ICD-10-CM | POA: Diagnosis not present

## 2023-12-22 DIAGNOSIS — E860 Dehydration: Secondary | ICD-10-CM | POA: Diagnosis present

## 2023-12-22 DIAGNOSIS — R935 Abnormal findings on diagnostic imaging of other abdominal regions, including retroperitoneum: Secondary | ICD-10-CM | POA: Diagnosis not present

## 2023-12-22 DIAGNOSIS — Y838 Other surgical procedures as the cause of abnormal reaction of the patient, or of later complication, without mention of misadventure at the time of the procedure: Secondary | ICD-10-CM | POA: Diagnosis present

## 2023-12-22 DIAGNOSIS — Z79899 Other long term (current) drug therapy: Secondary | ICD-10-CM

## 2023-12-22 DIAGNOSIS — Z823 Family history of stroke: Secondary | ICD-10-CM | POA: Diagnosis not present

## 2023-12-22 DIAGNOSIS — R571 Hypovolemic shock: Secondary | ICD-10-CM | POA: Diagnosis present

## 2023-12-22 DIAGNOSIS — Z7982 Long term (current) use of aspirin: Secondary | ICD-10-CM

## 2023-12-22 DIAGNOSIS — Z6841 Body Mass Index (BMI) 40.0 and over, adult: Secondary | ICD-10-CM

## 2023-12-22 DIAGNOSIS — F32A Depression, unspecified: Secondary | ICD-10-CM | POA: Diagnosis present

## 2023-12-22 DIAGNOSIS — E785 Hyperlipidemia, unspecified: Secondary | ICD-10-CM | POA: Diagnosis present

## 2023-12-22 DIAGNOSIS — Z7951 Long term (current) use of inhaled steroids: Secondary | ICD-10-CM | POA: Diagnosis not present

## 2023-12-22 DIAGNOSIS — N179 Acute kidney failure, unspecified: Secondary | ICD-10-CM | POA: Diagnosis present

## 2023-12-22 DIAGNOSIS — Z5982 Transportation insecurity: Secondary | ICD-10-CM | POA: Diagnosis not present

## 2023-12-22 DIAGNOSIS — R112 Nausea with vomiting, unspecified: Secondary | ICD-10-CM | POA: Diagnosis present

## 2023-12-22 DIAGNOSIS — R1084 Generalized abdominal pain: Secondary | ICD-10-CM | POA: Diagnosis not present

## 2023-12-22 DIAGNOSIS — I10 Essential (primary) hypertension: Secondary | ICD-10-CM | POA: Diagnosis present

## 2023-12-22 DIAGNOSIS — Q2112 Patent foramen ovale: Secondary | ICD-10-CM

## 2023-12-22 DIAGNOSIS — I69351 Hemiplegia and hemiparesis following cerebral infarction affecting right dominant side: Secondary | ICD-10-CM

## 2023-12-22 DIAGNOSIS — K9589 Other complications of other bariatric procedure: Secondary | ICD-10-CM | POA: Diagnosis present

## 2023-12-22 DIAGNOSIS — F419 Anxiety disorder, unspecified: Secondary | ICD-10-CM | POA: Diagnosis present

## 2023-12-22 DIAGNOSIS — R55 Syncope and collapse: Secondary | ICD-10-CM | POA: Diagnosis present

## 2023-12-22 DIAGNOSIS — I9589 Other hypotension: Secondary | ICD-10-CM | POA: Diagnosis not present

## 2023-12-22 LAB — COMPREHENSIVE METABOLIC PANEL WITH GFR
ALT: 12 U/L (ref 0–44)
AST: 16 U/L (ref 15–41)
Albumin: 3.2 g/dL — ABNORMAL LOW (ref 3.5–5.0)
Alkaline Phosphatase: 56 U/L (ref 38–126)
Anion gap: 19 — ABNORMAL HIGH (ref 5–15)
BUN: 57 mg/dL — ABNORMAL HIGH (ref 6–20)
CO2: 20 mmol/L — ABNORMAL LOW (ref 22–32)
Calcium: 8.6 mg/dL — ABNORMAL LOW (ref 8.9–10.3)
Chloride: 100 mmol/L (ref 98–111)
Creatinine, Ser: 2.74 mg/dL — ABNORMAL HIGH (ref 0.44–1.00)
GFR, Estimated: 21 mL/min — ABNORMAL LOW (ref >60)
Glucose, Bld: 91 mg/dL (ref 70–99)
Potassium: 4 mmol/L (ref 3.5–5.1)
Sodium: 139 mmol/L (ref 135–145)
Total Bilirubin: 0.9 mg/dL (ref 0.0–1.2)
Total Protein: 7 g/dL (ref 6.5–8.1)

## 2023-12-22 LAB — CBC WITH DIFFERENTIAL/PLATELET
Abs Immature Granulocytes: 0.03 K/uL (ref 0.00–0.07)
Basophils Absolute: 0 K/uL (ref 0.0–0.1)
Basophils Relative: 0 %
Eosinophils Absolute: 0.2 K/uL (ref 0.0–0.5)
Eosinophils Relative: 3 %
HCT: 35.4 % — ABNORMAL LOW (ref 36.0–46.0)
Hemoglobin: 11 g/dL — ABNORMAL LOW (ref 12.0–15.0)
Immature Granulocytes: 0 %
Lymphocytes Relative: 14 %
Lymphs Abs: 1 K/uL (ref 0.7–4.0)
MCH: 27.1 pg (ref 26.0–34.0)
MCHC: 31.1 g/dL (ref 30.0–36.0)
MCV: 87.2 fL (ref 80.0–100.0)
Monocytes Absolute: 0.8 K/uL (ref 0.1–1.0)
Monocytes Relative: 11 %
Neutro Abs: 5.1 K/uL (ref 1.7–7.7)
Neutrophils Relative %: 72 %
Platelets: 224 K/uL (ref 150–400)
RBC: 4.06 MIL/uL (ref 3.87–5.11)
RDW: 18.6 % — ABNORMAL HIGH (ref 11.5–15.5)
WBC: 7.1 K/uL (ref 4.0–10.5)
nRBC: 0 % (ref 0.0–0.2)

## 2023-12-22 LAB — TYPE AND SCREEN
ABO/RH(D): O POS
Antibody Screen: NEGATIVE

## 2023-12-22 LAB — TROPONIN I (HIGH SENSITIVITY)
Troponin I (High Sensitivity): 5 ng/L (ref <18)
Troponin I (High Sensitivity): 5 ng/L (ref <18)

## 2023-12-22 LAB — HCG, SERUM, QUALITATIVE: Preg, Serum: NEGATIVE

## 2023-12-22 LAB — I-STAT CG4 LACTIC ACID, ED
Lactic Acid, Venous: 1.4 mmol/L (ref 0.5–1.9)
Lactic Acid, Venous: 1.3 mmol/L (ref 0.5–1.9)

## 2023-12-22 LAB — PROTIME-INR
INR: 1.1 (ref 0.8–1.2)
Prothrombin Time: 15.1 s (ref 11.4–15.2)

## 2023-12-22 LAB — POC OCCULT BLOOD, ED: Fecal Occult Bld: POSITIVE — AB

## 2023-12-22 MED ORDER — POLYETHYLENE GLYCOL 3350 17 G PO PACK: 17.0000 g | PACK | Freq: Every day | ORAL | Status: DC | PRN

## 2023-12-22 MED ORDER — ALBUTEROL SULFATE (2.5 MG/3ML) 0.083% IN NEBU: 2.5000 mg | INHALATION_SOLUTION | RESPIRATORY_TRACT | Status: DC | PRN

## 2023-12-22 MED ORDER — PANTOPRAZOLE SODIUM 40 MG IV SOLR
40.0000 mg | Freq: Two times a day (BID) | INTRAVENOUS | Status: DC
  Administered 2023-12-22 – 2023-12-23 (×2): 40 mg via INTRAVENOUS
  Filled 2023-12-22 (×2): qty 10

## 2023-12-22 MED ORDER — SODIUM CHLORIDE 0.9% FLUSH
3.0000 mL | Freq: Two times a day (BID) | INTRAVENOUS | Status: DC
  Administered 2023-12-23: 3 mL via INTRAVENOUS

## 2023-12-22 MED ORDER — SENNA 8.6 MG PO TABS
2.0000 | ORAL_TABLET | Freq: Every day | ORAL | Status: DC
  Administered 2023-12-22: 17.2 mg via ORAL
  Filled 2023-12-22: qty 2

## 2023-12-22 MED ORDER — MELATONIN 3 MG PO TABS: 6.0000 mg | ORAL_TABLET | Freq: Every evening | ORAL | Status: DC | PRN

## 2023-12-22 MED ORDER — LACTATED RINGERS IV BOLUS (SEPSIS)
1000.0000 mL | Freq: Once | INTRAVENOUS | Status: AC
  Administered 2023-12-22: 1000 mL via INTRAVENOUS

## 2023-12-22 MED ORDER — LACTATED RINGERS IV SOLN: INTRAVENOUS | Status: DC

## 2023-12-22 MED ORDER — BISACODYL 10 MG RE SUPP: 10.0000 mg | Freq: Every day | RECTAL | Status: DC | PRN

## 2023-12-22 MED ORDER — ACETAMINOPHEN 500 MG PO TABS: 1000.0000 mg | ORAL_TABLET | Freq: Four times a day (QID) | ORAL | Status: DC | PRN

## 2023-12-22 MED ORDER — ONDANSETRON HCL 4 MG/2ML IJ SOLN
4.0000 mg | Freq: Four times a day (QID) | INTRAMUSCULAR | Status: DC | PRN
  Administered 2023-12-22: 4 mg via INTRAVENOUS
  Filled 2023-12-22: qty 2

## 2023-12-22 NOTE — ED Notes (Signed)
 Called CCMD at [phone] to initiate cardiac monitoring as ordered.

## 2023-12-22 NOTE — ED Notes (Signed)
 Admitting physician at bedside

## 2023-12-22 NOTE — ED Notes (Signed)
 No [employer] label printer in room 22. Unable to print lab labels; had to use patient labels instead.

## 2023-12-22 NOTE — ED Triage Notes (Signed)
 Pt via [facility] from home after near-syncopal episode. Pt has RNY Gastric bypass surgery June 25th and is still unable to tolerate PO intake with constant nausea and vomiting bile for weeks. Also notes black stool; no iron supplements. EMS reports no palpable BP or radial pulse at time of arrival on scene. Admin 500cc NS via 20ga IV in left AC, then BP was manually obtained 70s/40s. Pt arrives to ED with GCS 15, tearful/emotional, and fatigued. States she had been able to graduate to soft foods very briefly before her extreme nausea set in. BP at time of triage is 83/68 (74). No pain.

## 2023-12-22 NOTE — ED Notes (Signed)
 CCMD contacted

## 2023-12-22 NOTE — ED Notes (Signed)
 Gave pt oral contract for CT scan and advised that she should drink as much as she can tolerate over the next hour.

## 2023-12-22 NOTE — ED Provider Notes (Signed)
 [place] EMERGENC[facility] DEPARTMENT AT [facility] Provider Note   CSN: [id number] Arrival date & time: 12/22/23  1757     Patient presents with: Near Syncope   Megan Hale is a 48 y.o. female.    Near Syncope     Patient has a history of stroke morbid obesity migraine depression hypertension laparoscopic gastric sleeve surgery.  Patient states she had a procedure back in June.  Ever since then she has been having trouble with nausea and vomiting.  Patient states more recently the symptoms have been worse over the last couple weeks.  Patient states she also started noticing some dark stool.  She has been following with her surgeon as well as GI.  Patient states she was shopping today was going home and suddenly felt very weak like she was going to pass out.  EMS was called.  They noted that her blood pressure was low.  Patient states she is not having any abdominal pain this time.  No chest pain.  She has not noticed any bright red blood in her stool.  Prior to Admission medications   Medication Sig Start Date End Date Taking? Authorizing Provider  atorvastatin  (LIPITOR) 40 MG tablet Take 40 mg by mouth every evening. 05/27/20   [provider]  buPROPion  (WELLBUTRIN  XL) 300 MG 24 hr tablet Take 300 mg by mouth in the morning. 06/29/20   [provider]  Cholecalciferol (VITAMIN D-3) 125 MCG (5000 UT) TABS Take 5,000 Units by mouth 3 (three) times a week.    [provider]  citalopram (CELEXA) 10 MG tablet Take 10 mg by mouth in the morning. 06/29/20   [provider]  clopidogrel (PLAVIX) 75 MG tablet Take 75 mg by mouth in the morning. 05/27/20   [provider]  fluticasone (FLONASE) 50 MCG/ACT nasal spray Place 1 spray into both nostrils See admin instructions. Instill 1 spray into each nostril (scheduled) every morning & may instill an additional dose in the evening if needed for allergies.    [provider]   fluticasone-salmeterol (ADVAIR HFA) 115-21 MCG/ACT inhaler Inhale 2 puffs into the lungs 2 (two) times daily.    [provider]  lisinopril (ZESTRIL) 10 MG tablet Take 10 mg by mouth in the morning and at bedtime. Morning & night    [provider]  loratadine (CLARITIN) 10 MG tablet Take 10 mg by mouth in the morning. AllerClear    [provider]  methocarbamol (ROBAXIN) 750 MG tablet Take 750 mg by mouth daily as needed for muscle spasms.    [provider]  montelukast (SINGULAIR) 10 MG tablet Take 10 mg by mouth in the morning.    [provider]  Multiple Vitamin (MULTIVITAMIN WITH MINERALS) TABS tablet Take 1 tablet by mouth in the morning.    [provider]  traMADol (ULTRAM) 50 MG tablet Take 50 mg by mouth every 6 (six) hours as needed.    [provider]  zonisamide (ZONEGRAN) 25 MG capsule Take 25 mg by mouth daily.    [provider]    Allergies: Patient has no known allergies.    Review of Systems  Cardiovascular:  Positive for near-syncope.    Updated Vital Signs BP 108/61   Pulse (!) 58   Temp 98.3 F (36.8 C) (Oral)   Resp 15   Ht 1.689 m (5' 6.5)   Wt 115.7 kg   LMP 12/08/2023 (Approximate)   SpO2 100%   BMI  40.54 kg/m   Physical Exam Vitals and nursing note reviewed.  Constitutional:      Appearance: She is well-developed. She is not diaphoretic.  HENT:     Head: Normocephalic and atraumatic.     Right Ear: External ear normal.     Left Ear: External ear normal.  Eyes:     General: No scleral icterus.       Right eye: No discharge.        Left eye: No discharge.     Conjunctiva/sclera: Conjunctivae normal.  Neck:     Trachea: No tracheal deviation.  Cardiovascular:     Rate and Rhythm: Normal rate and regular rhythm.  Pulmonary:     Effort: Pulmonary effort is normal. No respiratory distress.     Breath sounds: Normal breath sounds. No stridor. No wheezing or rales.   Abdominal:     General: Bowel sounds are normal. There is no distension.     Palpations: Abdomen is soft.     Tenderness: There is no abdominal tenderness. There is no guarding or rebound.  Genitourinary:    Comments: No melena noted on rectal exam Musculoskeletal:        General: No tenderness or deformity.     Cervical back: Neck supple.  Skin:    General: Skin is warm and dry.     Findings: No rash.  Neurological:     General: No focal deficit present.     Mental Status: She is alert.     Cranial Nerves: No cranial nerve deficit, dysarthria or facial asymmetry.     Sensory: No sensory deficit.     Motor: No abnormal muscle tone or seizure activity.     Coordination: Coordination normal.  Psychiatric:        Mood and Affect: Mood normal.     (all labs ordered are listed, but only abnormal results are displayed) Labs Reviewed  COMPREHENSIVE METABOLIC PANEL WITH GFR - Abnormal; Notable for the following components:      Result Value   CO2 20 (*)    BUN 57 (*)    Creatinine, Ser 2.74 (*)    Calcium  8.6 (*)    Albumin 3.2 (*)    GFR, Estimated 21 (*)    Anion gap 19 (*)    All other components within normal limits  CBC WITH DIFFERENTIAL/PLATELET - Abnormal; Notable for the following components:   Hemoglobin 11.0 (*)    HCT 35.4 (*)    RDW 18.6 (*)    All other components within normal limits  POC OCCULT BLOOD, ED - Abnormal; Notable for the following components:   Fecal Occult Bld POSITIVE (*)    All other components within normal limits  CULTURE, BLOOD (ROUTINE X 2)  CULTURE, BLOOD (ROUTINE X 2)  PROTIME-INR  HCG, SERUM, QUALITATIVE  URINALYSIS, W/ REFLEX TO CULTURE (INFECTION SUSPECTED)  I-STAT CG4 LACTIC ACID, ED  I-STAT CG4 LACTIC ACID, ED  TYPE AND SCREEN  TROPONIN I (HIGH SENSITIVITY)  TROPONIN I (HIGH SENSITIVITY)    EKG: EKG Interpretation Date/Time:  Saturday December 22 2023 18:09:37 EDT Ventricular Rate:  75 PR Interval:  189 QRS Duration:  90 QT  Interval:  383 QTC Calculation: 428 R Axis:   34  Text Interpretation: Sinus rhythm Consider left atrial enlargement Borderline low voltage, extremity leads ST elev, probable normal early repol pattern , more prominent since last tracing Confirmed by Randol Simmonds [phone]) on 12/22/2023 6:19:57 PM  Radiology: [facility] [address]  Result Date: 12/22/2023 EXAM: 1 VIEW XRAY OF THE CHEST 12/22/2023 06:50:00 PM COMPARISON: 09/03/2017 CLINICAL HISTORY: Questionable sepsis - evaluate for abnormality. Per triage: Pt via [facility] from home after near-syncopal episode. Pt has RNY Gastric bypass surgery June 25th and is still unable to tolerate PO intake with constant nausea and vomiting bile for weeks. Also notes black stool; no iron supplements. EMS reports no palpable BP or radial pulse at time of arrival on scene. Pt arrives to ED with GCS 15, tearful/emotional, and fatigued. States she had been able to graduate to soft foods very briefly before her extreme nausea set in. No pain. FINDINGS: LUNGS AND PLEURA: No focal pulmonary opacity. No pulmonary edema. No pleural effusion. No pneumothorax. HEART AND MEDIASTINUM: No acute abnormality of the cardiac and mediastinal silhouettes. BONES AND SOFT TISSUES: No acute osseous abnormality. IMPRESSION: 1. No acute findings. Electronically signed by: Pinkie Pebbles MD 12/22/2023 07:18 PM EDT RP Workstation: [id number]     .Critical Care  Performed by: Randol Simmonds, MD Authorized by: Randol Simmonds, MD   Critical care provider statement:    Critical care time (minutes):  30   Critical care was time spent personally by me on the following activities:  Development of treatment plan with patient or surrogate, discussions with consultants, evaluation of patient's response to treatment, examination of patient, ordering and review of laboratory studies, ordering and review of radiographic studies, ordering and performing treatments and interventions, pulse oximetry, re-evaluation of  patient's condition and review of old charts    Medications Ordered in the ED  lactated ringers  bolus 1,000 mL (0 mLs Intravenous Stopped 12/22/23 2006)  lactated ringers  bolus 1,000 mL (1,000 mLs Intravenous New Bag/Given 12/22/23 2014)    Clinical Course as of 12/22/23 2046  Sat Dec 22, 2023  2004 Comprehensive metabolic panel(!) Creatinine elevated 2.74, fecal occult is positive [JK]  2004 Hemoglobin is stable at 11 [JK]  2042 Case discussed with Dr Marsa regarding admission [JK]    Clinical Course User Index [JK] Randol Simmonds, MD                                 Medical Decision Making Problems Addressed: Acute kidney injury [facility]): acute illness or injury that poses a threat to life or bodily functions Dehydration: acute illness or injury that poses a threat to life or bodily functions  Amount and/or Complexity of Data Reviewed Labs: ordered. Decision-making details documented in ED Course. Radiology: ordered and independent interpretation performed.   Patient has been having issues with nausea and vomiting since her gastric bypass surgery in June.  Patient states she has had decreased p.o. intake.  She has followed up with her surgeon however symptoms have persisted.  Patient started feel weak and lightheaded today.  Patient was noted to be hypotensive.  Considered the possibly of acute blood loss, infection, dehydration, cardiac etiology.  Patient's EKG did not show any acute ischemic changes.  Initial troponin was normal.  Doubt cardiac etiology.  No lactic acidosis no leukocytosis.  Patient is not having any pain or fever.  Doubt acute infection.  Labs do show evidence of an acute kidney injury with elevated BUN and creatinine.  I suspect this is the etiology of her hypotension.  Patient has been treated with IV fluids and blood pressure is improved.  Will plan on admission to the hospital for further treatment.     Final diagnoses:  Dehydration  Acute  kidney injury  [facility])    ED Discharge Orders     None          Randol Simmonds, MD 12/22/23 2046

## 2023-12-22 NOTE — ED Notes (Signed)
Pt repositioned in bed, call light within reach.

## 2023-12-23 ENCOUNTER — Inpatient Hospital Stay (HOSPITAL_COMMUNITY)

## 2023-12-23 DIAGNOSIS — R112 Nausea with vomiting, unspecified: Secondary | ICD-10-CM | POA: Diagnosis present

## 2023-12-23 DIAGNOSIS — E860 Dehydration: Secondary | ICD-10-CM | POA: Diagnosis not present

## 2023-12-23 DIAGNOSIS — Q2112 Patent foramen ovale: Secondary | ICD-10-CM | POA: Diagnosis not present

## 2023-12-23 DIAGNOSIS — R935 Abnormal findings on diagnostic imaging of other abdominal regions, including retroperitoneum: Secondary | ICD-10-CM | POA: Diagnosis not present

## 2023-12-23 DIAGNOSIS — Z9884 Bariatric surgery status: Secondary | ICD-10-CM

## 2023-12-23 DIAGNOSIS — R1084 Generalized abdominal pain: Secondary | ICD-10-CM | POA: Diagnosis not present

## 2023-12-23 DIAGNOSIS — N179 Acute kidney failure, unspecified: Secondary | ICD-10-CM | POA: Diagnosis not present

## 2023-12-23 DIAGNOSIS — K922 Gastrointestinal hemorrhage, unspecified: Secondary | ICD-10-CM | POA: Diagnosis not present

## 2023-12-23 DIAGNOSIS — I9589 Other hypotension: Secondary | ICD-10-CM

## 2023-12-23 DIAGNOSIS — R579 Shock, unspecified: Secondary | ICD-10-CM | POA: Diagnosis present

## 2023-12-23 LAB — BASIC METABOLIC PANEL WITH GFR
Anion gap: 16 — ABNORMAL HIGH (ref 5–15)
BUN: 55 mg/dL — ABNORMAL HIGH (ref 6–20)
CO2: 21 mmol/L — ABNORMAL LOW (ref 22–32)
Calcium: 8.4 mg/dL — ABNORMAL LOW (ref 8.9–10.3)
Chloride: 100 mmol/L (ref 98–111)
Creatinine, Ser: 2.47 mg/dL — ABNORMAL HIGH (ref 0.44–1.00)
GFR, Estimated: 23 mL/min — ABNORMAL LOW (ref >60)
Glucose, Bld: 79 mg/dL (ref 70–99)
Potassium: 4 mmol/L (ref 3.5–5.1)
Sodium: 137 mmol/L (ref 135–145)

## 2023-12-23 LAB — ECHOCARDIOGRAM COMPLETE
AR max vel: 2.22 cm2
AV Area VTI: 2.64 cm2
AV Area mean vel: 2.19 cm2
AV Mean grad: 4 mmHg
AV Peak grad: 6.5 mmHg
Ao pk vel: 1.27 m/s
Area-P 1/2: 4.15 cm2
Height: 66.5 in
S' Lateral: 1.8 cm
Weight: 4271.63 [oz_av]

## 2023-12-23 LAB — ABO/RH: ABO/RH(D): O POS

## 2023-12-23 LAB — GLUCOSE, CAPILLARY
Glucose-Capillary: 82 mg/dL (ref 70–99)
Glucose-Capillary: 104 mg/dL — ABNORMAL HIGH (ref 70–99)
Glucose-Capillary: 76 mg/dL (ref 70–99)

## 2023-12-23 LAB — PHOSPHORUS: Phosphorus: 2.8 mg/dL (ref 2.5–4.6)

## 2023-12-23 LAB — MRSA NEXT GEN BY PCR, NASAL: MRSA by PCR Next Gen: NOT DETECTED

## 2023-12-23 LAB — CORTISOL: Cortisol, Plasma: 11.2 ug/dL

## 2023-12-23 LAB — HIV ANTIBODY (ROUTINE TESTING W REFLEX): HIV Screen 4th Generation wRfx: NONREACTIVE

## 2023-12-23 LAB — HEMOGLOBIN A1C
Hgb A1c MFr Bld: 5.4 % (ref 4.8–5.6)
Mean Plasma Glucose: 108.28 mg/dL

## 2023-12-23 LAB — MAGNESIUM: Magnesium: 2.1 mg/dL (ref 1.7–2.4)

## 2023-12-23 MED ORDER — ONDANSETRON HCL 4 MG/2ML IJ SOLN: 4.0000 mg | Freq: Four times a day (QID) | INTRAMUSCULAR | Status: AC | PRN

## 2023-12-23 MED ORDER — INSULIN ASPART 100 UNIT/ML IJ SOLN: 0.0000 [IU] | Freq: Three times a day (TID) | INTRAMUSCULAR | Status: DC

## 2023-12-23 MED ORDER — CHLORHEXIDINE GLUCONATE CLOTH 2 % EX PADS: 6.0000 | MEDICATED_PAD | Freq: Every day | CUTANEOUS | Status: AC

## 2023-12-23 MED ORDER — ACETAMINOPHEN 500 MG PO TABS: 1000.0000 mg | ORAL_TABLET | Freq: Four times a day (QID) | ORAL | Status: AC | PRN

## 2023-12-23 MED ORDER — HEPARIN SODIUM (PORCINE) 5000 UNIT/ML IJ SOLN
5000.0000 [IU] | Freq: Three times a day (TID) | INTRAMUSCULAR | Status: DC
  Administered 2023-12-23: 5000 [IU] via SUBCUTANEOUS
  Filled 2023-12-23: qty 1

## 2023-12-23 MED ORDER — PANTOPRAZOLE SODIUM 40 MG IV SOLR: 40.0000 mg | Freq: Two times a day (BID) | INTRAVENOUS | Status: AC

## 2023-12-23 MED ORDER — NOREPINEPHRINE 4 MG/250ML-% IV SOLN: 0.0000 ug/min | INTRAVENOUS | Status: DC

## 2023-12-23 MED ORDER — SIMETHICONE 80 MG PO CHEW
80.0000 mg | CHEWABLE_TABLET | Freq: Four times a day (QID) | ORAL | Status: DC | PRN
  Administered 2023-12-23: 80 mg via ORAL
  Filled 2023-12-23 (×2): qty 1

## 2023-12-23 MED ORDER — NOREPINEPHRINE 4 MG/250ML-% IV SOLN
0.0000 ug/min | INTRAVENOUS | Status: DC
  Administered 2023-12-23: 5 ug/min via INTRAVENOUS
  Filled 2023-12-23: qty 250

## 2023-12-23 MED ORDER — PROCHLORPERAZINE EDISYLATE 10 MG/2ML IJ SOLN
10.0000 mg | Freq: Four times a day (QID) | INTRAMUSCULAR | Status: DC | PRN
  Administered 2023-12-23: 10 mg via INTRAVENOUS
  Filled 2023-12-23: qty 2

## 2023-12-23 MED ORDER — MAGNESIUM SULFATE IN D5W 1-5 GM/100ML-% IV SOLN
1.0000 g | Freq: Once | INTRAVENOUS | Status: AC
  Administered 2023-12-23: 1 g via INTRAVENOUS
  Filled 2023-12-23: qty 100

## 2023-12-23 MED ORDER — MELATONIN 3 MG PO TABS: 6.0000 mg | ORAL_TABLET | Freq: Every evening | ORAL | Status: AC | PRN

## 2023-12-23 MED ORDER — ORAL CARE MOUTH RINSE: 15.0000 mL | OROMUCOSAL | Status: DC | PRN

## 2023-12-23 MED ORDER — MIDODRINE HCL 5 MG PO TABS
10.0000 mg | ORAL_TABLET | Freq: Three times a day (TID) | ORAL | Status: DC
  Administered 2023-12-23: 10 mg via ORAL
  Filled 2023-12-23: qty 2

## 2023-12-23 MED ORDER — HEPARIN SODIUM (PORCINE) 5000 UNIT/ML IJ SOLN: 5000.0000 [IU] | Freq: Three times a day (TID) | INTRAMUSCULAR | Status: AC

## 2023-12-23 MED ORDER — LACTATED RINGERS IV SOLN: 100.0000 mL | INTRAVENOUS | Status: AC

## 2023-12-23 MED ORDER — BISACODYL 10 MG RE SUPP: 10.0000 mg | Freq: Every day | RECTAL | Status: AC | PRN

## 2023-12-23 MED ORDER — CHLORHEXIDINE GLUCONATE CLOTH 2 % EX PADS
6.0000 | MEDICATED_PAD | Freq: Every day | CUTANEOUS | Status: DC
  Administered 2023-12-23: 6 via TOPICAL

## 2023-12-23 MED ORDER — SODIUM CHLORIDE 0.9 % IV SOLN: 250.0000 mL | INTRAVENOUS | Status: AC

## 2023-12-23 MED ORDER — INSULIN ASPART 100 UNIT/ML IJ SOLN: 0.0000 [IU] | Freq: Three times a day (TID) | INTRAMUSCULAR | Status: AC

## 2023-12-23 MED ORDER — SIMETHICONE 80 MG PO CHEW: 80.0000 mg | CHEWABLE_TABLET | Freq: Four times a day (QID) | ORAL | Status: AC | PRN

## 2023-12-23 MED ORDER — SODIUM CHLORIDE 0.9 % IV SOLN: 250.0000 mL | INTRAVENOUS | Status: DC

## 2023-12-23 MED ORDER — POLYETHYLENE GLYCOL 3350 17 G PO PACK: 17.0000 g | PACK | Freq: Every day | ORAL | Status: AC | PRN

## 2023-12-23 MED ORDER — PROCHLORPERAZINE EDISYLATE 10 MG/2ML IJ SOLN: 10.0000 mg | Freq: Four times a day (QID) | INTRAMUSCULAR | Status: AC | PRN

## 2023-12-23 MED ORDER — LACTATED RINGERS IV BOLUS
1000.0000 mL | Freq: Once | INTRAVENOUS | Status: AC
  Administered 2023-12-23: 1000 mL via INTRAVENOUS

## 2023-12-23 MED ORDER — MIDODRINE HCL 10 MG PO TABS: 10.0000 mg | ORAL_TABLET | Freq: Three times a day (TID) | ORAL | Status: AC

## 2023-12-23 MED ORDER — LACTATED RINGERS IV BOLUS
500.0000 mL | Freq: Once | INTRAVENOUS | Status: AC
  Administered 2023-12-23: 500 mL via INTRAVENOUS

## 2023-12-23 MED ORDER — CALCIUM GLUCONATE-NACL 1-0.675 GM/50ML-% IV SOLN
1.0000 g | Freq: Once | INTRAVENOUS | Status: AC
  Administered 2023-12-23: 1000 mg via INTRAVENOUS
  Filled 2023-12-23: qty 50

## 2023-12-23 NOTE — ED Notes (Signed)
 Patient transported to CT

## 2023-12-23 NOTE — ED Notes (Signed)
 Bladder scan attempted x10 by tech and this paramedic with no success

## 2023-12-23 NOTE — Progress Notes (Signed)
 eLink Physician-Brief Progress Note Patient Name: Megan Hale DOB: 1975-07-27 MRN: [id number]   Date of Service  12/23/2023  HPI/Events of Note  63 F obese (BMI 41) s/p recent gastric bypass 11/28/2023,OSA, hx of CVA with residual R sided weakness. She has been having nausea and vomiting and now with diarrhea after having constipation for 3 weeks. She describes melanotic stools. EMS was called due to presyncopal episode on the way home from shopping. On EMS arrival SBP 70s. Creatinine 2.74 (0.8), H/H 11/35, FOBT positive, lactic acid 1.3. She was given 2.5 L fluids but remained hypotensive and was started on noprepinephrine.  Abdominal CT (report) with mildly dilated loops of small bowel in the left mid abdomen, favoring small bowel enteritis over SBO  eICU Interventions  Hypovolemic shock, continued on fluids. Pre-renal azotemia, monitor renal function closely. Surgery to be consulted if GI symptoms persistent     Intervention Category Evaluation Type: New Patient Evaluation  Damien ONEIDA Grout 12/23/2023, 4:25 AM

## 2023-12-23 NOTE — Discharge Summary (Addendum)
 Physician Discharge Summary   Patient ID: Megan Hale [id number] 47 y.o. Sep 03, 1975  Admit date: 12/22/2023  Discharge date and time: 12/23/23 3:16 PM   Admitting Physician: Mancel CHRISTELLA Ply, MD   Discharge Physician: Donnice JONELLE Beals, MD  Admission Diagnoses: Dehydration [E86.0] Shock (HCC) [R57.9] Acute kidney injury [facility]) [N17.9]  Discharge Diagnoses: Principal Problem:   Dehydration Active Problems:   Anxiety   Shock (HCC)   Nausea and vomiting   Abnormal findings on diagnostic imaging of other abdominal regions, including retroperitoneum   Status post bariatric surgery    Admission Condition: critical  Discharged Condition: fair  Indication for Admission: shock, AKI  Hospital Course:   48 year old woman prior to gastric sleeve converted to Roux-en-Y 11/28/2023 presents to the hospital with weeks of nausea vomiting poor p.o. intake found to be hypotensive with hypovolemic shock as well as acute renal failure.  She was resuscitated with crystalloid.  CT abdomen pelvis demonstrated possible mural thickening with surrounding stranding, enteritis versus partial SBO.  Creatinine was over 3.  She was admitted to ICU for vasopressor support with ongoing hypotension despite aggressive IV fluid resuscitation.  With additional IV fluids vasopressors were weaned off.  Her creatinine had improved to 2.5 the next morning.  She was having good urine output.  She was ambulatory around the unit.  Normotensive, satting 100% on room air.  She was downgraded from ICU status to floor, telemetry.  Not tachycardic.  GI was consulted for possible melena, anemia is stable.  They recommend transfer to associated with bariatric surgeon as concern for possible persistent SBO given clinical course of inability to tolerate p.o. consistently for the last several weeks.  I discussed with the surgeon on-call at [facility] hospital, [facility].  They agreed to transfer.  I requested CT abdomen  pelvis without contrast obtained in the ED on admission to be [facility] to [facility] for their review.  Consults: GI  Significant Diagnostic Studies: As per EMR  Treatments: IV fluids vasopressors antiemetics   Disposition: Discharge disposition: [facility] Not Defined       Patient Instructions:  Allergies as of 12/23/2023   No Known Allergies      Medication List     STOP taking these medications    aspirin  EC 81 MG tablet   atorvastatin  40 MG tablet Commonly known as: LIPITOR   BIOTIN PO   buPROPion  300 MG 24 hr tablet Commonly known as: WELLBUTRIN  XL   citalopram 20 MG tablet Commonly known as: CELEXA   clopidogrel 75 MG tablet Commonly known as: PLAVIX   fluticasone 50 MCG/ACT nasal spray Commonly known as: FLONASE   fluticasone-salmeterol 115-21 MCG/ACT inhaler Commonly known as: ADVAIR HFA   lisinopril 10 MG tablet Commonly known as: ZESTRIL   loratadine 10 MG tablet Commonly known as: CLARITIN   methocarbamol 750 MG tablet Commonly known as: ROBAXIN   montelukast 10 MG tablet Commonly known as: SINGULAIR   multivitamin with minerals Tabs tablet   omeprazole 40 MG capsule Commonly known as: PRILOSEC   PROBIOTICS + BARIATRIC MULTI PO   traMADol 50 MG tablet Commonly known as: ULTRAM   Vitamin D-3 125 MCG (5000 UT) Tabs   zonisamide 25 MG capsule Commonly known as: ZONEGRAN       TAKE these medications    acetaminophen  500 MG tablet Commonly known as: TYLENOL  Take 2 tablets (1,000 mg total) by mouth every 6 (six) hours as needed for mild pain (pain score 1-3).   bisacodyl   10 MG suppository Commonly known as: DULCOLAX Place 1 suppository (10 mg total) rectally daily as needed for moderate constipation.   Chlorhexidine  Gluconate Cloth 2 % Pads Apply 6 each topically daily. Start taking on: December 24, 2023   heparin  5000 UNIT/ML injection Inject 1 mL (5,000 Units total) into the skin every 8 (eight)  hours.   insulin  aspart 100 UNIT/ML injection Commonly known as: novoLOG  Inject 0-6 Units into the skin 3 (three) times daily with meals.   lactated ringers  infusion Inject 100 mLs into the vein continuous.   melatonin 3 MG Tabs tablet Take 2 tablets (6 mg total) by mouth at bedtime as needed.   midodrine  10 MG tablet Commonly known as: PROAMATINE  Take 1 tablet (10 mg total) by mouth every 8 (eight) hours.   ondansetron  4 MG/2ML Soln injection Commonly known as: ZOFRAN  Inject 2 mLs (4 mg total) into the vein every 6 (six) hours as needed for nausea or vomiting.   pantoprazole  40 MG injection Commonly known as: PROTONIX  Inject 40 mg into the vein every 12 (twelve) hours.   polyethylene glycol 17 g packet Commonly known as: MIRALAX  / GLYCOLAX  Take 17 g by mouth daily as needed for mild constipation.   prochlorperazine  10 MG/2ML injection Commonly known as: COMPAZINE  Inject 2 mLs (10 mg total) into the vein every 6 (six) hours as needed.   simethicone  80 MG chewable tablet Commonly known as: MYLICON Chew 1 tablet (80 mg total) by mouth 4 (four) times daily as needed for flatulence.   sodium chloride  0.9 % infusion Inject 250 mLs into the vein continuous.       Activity: activity as tolerated Diet: regular diet Wound Care: none needed    SignedBETHA Donnice SAUNDERS Charde Macfarlane 12/23/2023 3:15 PM

## 2023-12-23 NOTE — ED Notes (Signed)
 CT messaged this provider requesting an update on pt contrast drink. Pt states she is still feeling nausea and has been unable to drink more. Pt given zofran  and encouraged to drink contrast.

## 2023-12-23 NOTE — Consult Note (Signed)
 Consultation  Referring Provider: TRH/Segars Primary Care Physician:  Pcp, No Primary Gastroenterologist: Sampson  Reason for Consultation: Hypotension, acute kidney injury, persistent nausea and vomiting post a very recent robotic conversion of gastric sleeve to Roux-en-Y, reports of dark stool since surgery  HPI: Megan Hale is a 48 y.o. female with history of morbid obesity, history of CVA in 2013 and again in 2017, on aspirin  and Plavix, hypertension, hyperlipidemia, and sleep apnea.  She had undergone remote gastric sleeve surgery, and did lose weight but apparently had plateaued and decision to proceed with conversion to Roux-en-Y gastric bypass was based on trying to reduce her risk factors with persistent morbid obesity. She underwent robotic conversion of the gastric sleeve to Roux-en-Y Oestreich bypass on 11/28/2023 done at [facility]).  She says she has not done well since the surgery and has had persistent problems with nausea and vomiting and inability to keep down p.o.'s.  She has been seen a couple of times in the bariatric clinic postoperatively, with goal to be able to continue to push fluids Gatorade and protein shakes.  She was seen there on 12/13/2023 apparently at that time doing a bit better with p.o. intake. Due to persistent symptoms she underwent outpatient small bowel follow-through on 12/19/2023-noted to have a small gastric pouch, no clear ulceration or extraluminal contrast extravasation contrast passed into the jejunum, proximal small bowel jejunal loops distended in the mid and left side of the abdomen, normal-appearing decompressed small bowel loops distally suggestive of possible narrowing at the small bowel anastomosis, there is normal caliber appearing small bowel distally in the right lower abdomen with contrast flowing into the colon, duodenal sweep and proximal small bowel normal.  Patient was admitted here yesterday after she had a  presyncopal episode, was found to be hypotensive with systolic pressure in the 70s.  Per the ER and admission notes she initially responded to fluids then had recurrence of hypotension which persisted, she had been started on pressors and was admitted to the ICU.  Labs on arrival consistent with acute kidney injury/dehydration with BUN 57/creatinine 2.74 WBC 7.1/hemoglobin 11/hematocrit 35/MCV 87/platelets 234 Troponins were negative Lactate 1.4 LFTs within normal limits Blood cultures pending  Repeat hemoglobin pending this morning  She is feeling much better today blood pressure in the high 90s systolic on low-dose Levophed  She has not had any active vomiting. Does complain of ongoing abdominal cramping which has been present over the past couple of weeks. Reports stools have been darker ever since her surgery no gross hematochezia, usually having 1 bowel movement per day, last bowel movement yesterday She has been vomiting on a daily basis at least a couple of times has not had any hematemesis or coffee-ground emesis  Previous notes had suggested that she did not want to be transferred to [facility].  She had her surgery at the [facility] and did not like the care there.  She does not have any issue with her bariatric surgeon and would be amenable to transfer to [facility] if indicated.  She had resumed Plavix and aspirin  postoperatively with last dose Plavix yesterday   Past Medical History:  Diagnosis Date   Anxiety    CVA (cerebral vascular accident) (HCC)    Acute left Basal Ganglia - right hemiplagia - Dr Daralyn   Depression    Hyperlipidemia    Hypertension    Migraine    Morbid obesity (HCC)    OSA (obstructive sleep apnea)    Bi-Pap  Vitamin D deficiency     Past Surgical History:  Procedure Laterality Date   BUBBLE STUDY  01/12/2021   Procedure: BUBBLE STUDY;  Surgeon: Jeffrie Oneil BROCKS, MD;  Location: [facility] ENDOSCOPY;  Service: Cardiovascular;;   LAPAROSCOPIC GASTRIC  SLEEVE RESECTION     TEE WITHOUT CARDIOVERSION N/A 01/12/2021   Procedure: TRANSESOPHAGEAL ECHOCARDIOGRAM (TEE);  Surgeon: Jeffrie Oneil BROCKS, MD;  Location: [facility] ENDOSCOPY;  Service: Cardiovascular;  Laterality: N/A;    Prior to Admission medications   Medication Sig Start Date End Date Taking? Authorizing Provider  aspirin  EC 81 MG tablet Take 81 mg by mouth daily.   Yes [provider]  atorvastatin  (LIPITOR) 40 MG tablet Take 40 mg by mouth every evening. 05/27/20  Yes [provider]  BIOTIN PO Take 1 tablet by mouth daily.   Yes [provider]  buPROPion  (WELLBUTRIN  XL) 300 MG 24 hr tablet Take 300 mg by mouth in the morning. 06/29/20  Yes [provider]  Cholecalciferol (VITAMIN D-3) 125 MCG (5000 UT) TABS Take 5,000 Units by mouth 3 (three) times a week.   Yes [provider]  citalopram (CELEXA) 20 MG tablet Take 20 mg by mouth daily. 04/11/23  Yes [provider]  clopidogrel (PLAVIX) 75 MG tablet Take 75 mg by mouth in the morning. 05/27/20  Yes [provider]  fluticasone (FLONASE) 50 MCG/ACT nasal spray Place 1 spray into both nostrils See admin instructions. Instill 1 spray into each nostril (scheduled) every morning & may instill an additional dose in the evening if needed for allergies.   Yes [provider]  fluticasone-salmeterol (ADVAIR HFA) 115-21 MCG/ACT inhaler Inhale 2 puffs into the lungs 2 (two) times daily.   Yes [provider]  lisinopril (ZESTRIL) 10 MG tablet Take 10 mg by mouth in the morning and at bedtime. Morning & night   Yes [provider]  methocarbamol (ROBAXIN) 750 MG tablet Take 750 mg by mouth daily as needed for muscle spasms.   Yes [provider]  Multiple Vitamin (MULTIVITAMIN WITH MINERALS) TABS tablet Take 1 tablet by mouth in the morning.   Yes [provider]  Multiple Vitamins-Minerals (PROBIOTICS + BARIATRIC MULTI PO) Take 1 tablet by mouth  daily.   Yes [provider]  omeprazole (PRILOSEC) 40 MG capsule Take 40 mg by mouth daily. 11/29/23  Yes [provider]  traMADol (ULTRAM) 50 MG tablet Take 50 mg by mouth every 6 (six) hours as needed.   Yes [provider]  zonisamide (ZONEGRAN) 25 MG capsule Take 25 mg by mouth daily.   Yes [provider]  loratadine (CLARITIN) 10 MG tablet Take 10 mg by mouth in the morning. AllerClear Patient not taking: Reported on 12/23/2023    [provider]  montelukast (SINGULAIR) 10 MG tablet Take 10 mg by mouth in the morning. Patient not taking: Reported on 12/23/2023    [provider]    Current Facility-Administered Medications  Medication Dose Route Frequency Provider Last Rate Last Admin   0.9 %  sodium chloride  infusion  250 mL Intravenous Continuous Albustami, Mancel HERO, MD       acetaminophen  (TYLENOL ) tablet 1,000 mg  1,000 mg Oral Q6H PRN Segars, Dorn, MD       bisacodyl  (DULCOLAX) suppository 10 mg  10 mg Rectal Daily PRN Keturah Dorn, MD       Chlorhexidine  Gluconate Cloth 2 % PADS 6 each  6 each Topical Daily Albustami, Mancel HERO, MD  insulin  aspart (novoLOG ) injection 0-6 Units  0-6 Units Subcutaneous TID WC Albustami, Omar M, MD       lactated ringers  infusion   Intravenous Continuous Albustami, Omar M, MD 150 mL/hr at 12/23/23 0848 Infusion Verify at 12/23/23 0848   melatonin tablet 6 mg  6 mg Oral QHS PRN Segars, Jonathan, MD       midodrine  (PROAMATINE ) tablet 10 mg  10 mg Oral Q8H Albustami, Omar M, MD   10 mg at 12/23/23 0556   norepinephrine  (LEVOPHED ) 4mg  in (0.016 mg/mL) premix infusion  0-10 mcg/min Intravenous Titrated Albustami, Omar M, MD 7.5 mL/hr at 12/23/23 0848 2 mcg/min at 12/23/23 0848   ondansetron  (ZOFRAN ) injection 4 mg  4 mg Intravenous Q6H PRN Keturah Carrier, MD   4 mg at 12/22/23 2355   Oral care mouth rinse  15 mL Mouth Rinse PRN Dub Mancel HERO, MD       pantoprazole  (PROTONIX )  injection 40 mg  40 mg Intravenous Q12H Segars, Jonathan, MD   40 mg at 12/22/23 2228   polyethylene glycol (MIRALAX  / GLYCOLAX ) packet 17 g  17 g Oral Daily PRN Segars, Jonathan, MD       prochlorperazine  (COMPAZINE ) injection 10 mg  10 mg Intravenous Q6H PRN Keturah Carrier, MD   10 mg at 12/23/23 0119   senna (SENOKOT) tablet 17.2 mg  2 tablet Oral QHS Keturah Carrier, MD   17.2 mg at 12/22/23 2228   sodium chloride  flush (NS) 0.9 % injection 3 mL  3 mL Intravenous Q12H Keturah Carrier, MD        Allergies as of 12/22/2023   (No Known Allergies)    Family History  Problem Relation Age of Onset   CVA Father 26   Hypertension Father    Stroke Mother    Heart disease Mother    Diabetes Mother    CVA Brother    Diabetes Brother        died of complications from Diabetes   Diabetes Sister    Heart attack Brother    Diabetes Brother    Stroke Paternal Grandfather     Social History   Socioeconomic History   Marital status: Single    Spouse name: Not on file   Number of children: 0   Years of education: 16   Highest education level: Not on file  Occupational History   Occupation: CUSTOMER SERVICE     Employer: [employer]    Comment: LONG TERM DISABILITY   Tobacco Use   Smoking status: Never   Smokeless tobacco: Never  Vaping Use   Vaping status: Never Used  Substance and Sexual Activity   Alcohol use: Not Currently   Drug use: No   Sexual activity: Not on file  Other Topics Concern   Not on file  Social History Narrative   Marital Status:  Single   Children:  None (She would like to adopt a little girl [blonde hair/blue eyes]).   Pets:   None    Living Situation: Her mother lived with her for 1.5 yrs after patient had her CVA.  Her mom moved back to her own home.     Occupation: She is currently out on LTD with ([employer]). 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     Education: Statistician (BA - History) [employer]    Tobacco Use/Exposure:  None    Alcohol  Use:  Occasional    Drug Use:  None   Diet:  Regular   Exercise:  Limited    Hobbies:  Poetry, Museums       No caffeine   Left handed now since stroke       Social Drivers of Health   Financial Resource Strain: Low Risk  (11/07/2023)   Received from [facility]   Overall Financial Resource Strain (CARDIA)    Difficulty of Paying Living Expenses: Not hard at all  Food Insecurity: No Food Insecurity (11/07/2023)   Received from [facility]   Hunger Vital Sign    Within the past 12 months, you worried that your food would run out before you got the money to buy more.: Never true    Within the past 12 months, the food you bought just didn't last and you didn't have money to get more.: Never true  Transportation Needs: Unmet Transportation Needs (11/07/2023)   Received from [facility] - Transportation    Lack of Transportation (Medical): Yes    Lack of Transportation (Non-Medical): Yes  Physical Activity: Insufficiently Active (11/07/2023)   Received from [facility]   Exercise Vital Sign    On average, how many days per week do you engage in moderate to strenuous exercise (like a brisk walk)?: 3 days    On average, how many minutes do you engage in exercise at this level?: 30 min  Stress: No Stress Concern Present (11/07/2023)   Received from [facility] of [facility] - Occupational Stress Questionnaire    Feeling of Stress : Only a little  Social Connections: Socially Integrated (11/07/2023)   Received from [facility]   Social Network    How would you rate your social network (family, work, friends)?: Good participation with social networks  Intimate Partner Violence: Not At Risk (11/28/2023)   Received from [facility]   HITS    Over the last 12 months how often did your partner physically hurt you?: Never    Over the last 12 months how often did your partner  insult you or talk down to you?: Never    Over the last 12 months how often did your partner threaten you with physical harm?: Never    Over the last 12 months how often did your partner scream or curse at you?: Never    Review of Systems: Pertinent positive and negative review of systems were noted in the above HPI section.  All other review of systems was otherwise negative.   Physical Exam: Vital signs in last 24 hours: Temp:  [97.7 F (36.5 C)-98.3 F (36.8 C)] 97.7 F (36.5 C) (07/20 0303) Pulse Rate:  [50-92] 58 (07/20 0845) Resp:  [14-28] 17 (07/20 0845) BP: (71-113)/(39-81) 106/81 (07/20 0845) SpO2:  [100 %] 100 % (07/20 0845) Weight:  [115.7 kg-121.1 kg] 121.1 kg (07/20 0500) Last BM Date : 12/22/23 General:   Alert,  Well-developed, overly obese African-American female, pleasant and cooperative in NAD Head:  Normocephalic and atraumatic. Eyes:  Sclera clear, no icterus.   Conjunctiva pink. Ears:  Normal auditory acuity. Nose:  No deformity, discharge,  or lesions. Mouth:  No deformity or lesions.   Neck:  Supple; no masses or thyromegaly. Lungs:  Clear throughout to auscultation.   No wheezes, crackles, or rhonchi.  Heart:  Regular rate and rhythm; no murmurs, clicks, rubs,  or gallops. Abdomen:  Soft, morbidly obese, BS quiet but present,,nonpalp mass or  hsm.  Difficult to appreciate distention due to habitus, incisional ports healing, mild tenderness in the hypogastrium Rectal: Heme positive but no melena on rectal exam Msk:  Symmetrical without gross deformities. . Pulses:  Normal pulses noted. Extremities:  Without clubbing or edema. Neurologic:  Alert and  oriented x4;  grossly normal neurologically. Skin:  Intact without significant lesions or rashes.. Psych:  Alert and cooperative. Normal mood and affect.  Intake/Output from previous day: 07/19 0701 - 07/20 0700 In: 5313.1 [I.V.:1665.8; IV Piggyback:3647.4] Out: 245 [Urine:245] Intake/Output this  shift: Total I/O In: 292.2 [I.V.:292.2] Out: -   Lab Results: Recent Labs    12/22/23 1845  WBC 7.1  HGB 11.0*  HCT 35.4*  PLT 224   BMET Recent Labs    12/22/23 1845  NA 139  K 4.0  CL 100  CO2 20*  GLUCOSE 91  BUN 57*  CREATININE 2.74*  CALCIUM  8.6*   LFT Recent Labs    12/22/23 1845  PROT 7.0  ALBUMIN 3.2*  AST 16  ALT 12  ALKPHOS 56  BILITOT 0.9   PT/INR Recent Labs    12/22/23 1845  LABPROT 15.1  INR 1.1   Hepatitis Panel No results for input(s): HEPBSAG, HCVAB, HEPAIGM, HEPBIGM in the last 72 hours.   IMPRESSION:  #30 48 year old African-American female status post conversion of gastric sleeve to robotic Roux-en-Y gastric bypass on 11/28/2023 done at /[facility].  Surgery done due to plateau of weight loss. Patient has not done well postoperatively, with persistent daily nausea and vomiting and difficulty keeping down p.o.'s and no ability to advance her diet.  Patient had a presyncopal episode yesterday, hypotensive per EMS with systolic blood pressure in the 70s.  Initially responded to IV fluids but then had recurrence of hypotension ultimately placed on pressor.  Labs consistent with dehydration and acute kidney injury secondary to poor p.o. intake  #2 report of dark stool since surgery, no melena on exam per ER, stool is heme positive Her hemoglobin preoperatively was 11.53 weeks ago and she was 11 yesterday No overt bleeding since admission, repeat hemoglobin pending today Certainly in the setting of aspirin  and Plavix that she may have had oozing from her anastomosis  #3 history of recurrent CVAs, on aspirin  and Plavix last dose yesterday  #4 abnormal small bowel follow-through done outpatient 12/19/2023, with similar findings on CT on admission concerning for narrowing at her new small bowel anastomosis with more normal-appearing small bowel distally. She is not completely obstructed but suspect that has been why she has been  unsuccessful with keeping down p.o.'s, and inability to advance her diet.  #5 hypertension #6.  History of sleep apnea  Plan; start sips of clears IV PPI twice daily Would not anticipate endoscopic evaluation at this time, would manage conservatively unless she has hemodynamically significant evidence of bleeding Serial hemoglobins and transfuse as indicated Hold Plavix, could continue aspirin   I think consideration needs to be given to transferring her to her bariatric surgeon at [facility] for further management     Rasheida Broden EsterwoodPA-C  12/23/2023, 9:51 AM

## 2023-12-23 NOTE — ED Notes (Signed)
 Admitting MD advised of pt low BP. Pt denies any new pain or discomfort.

## 2023-12-23 NOTE — Consult Note (Addendum)
 Initial Consultation Note   Patient: Megan Hale FMW:[id number] DOB: 07-Apr-1976 PCP: Pcp, No DOA: 12/22/2023 DOS: the patient was seen and examined on 12/23/2023 Primary service: Keturah Carrier, MD  Referring physician: Dr. Randol  Reason for consult: N/V, hypotension, presyncope   Assessment/Plan: Assessment and Plan:  48 F with hx of recent roux en Y gastric bypass at [facility] on 6/25, remote CVA '13, HTN, HLD, OSA, mood d/o, who presented due to presyncopal episode. Found to be hypotensive 70/40s with EMS and persistent hypotension in ED although initially fluid responsive, however now with recurrent hypotension after large volume IVF. Appears that she likely has post-op complication after gastric bypass with recent SBFT on 7/17 demonstrating dilated loops in the proximal jejunum in the L / mid abd and decompressed bowel distally suggesting possible narrowing at the anastamosis. Repeat imaging with CT A/P attempted with PO although did not tolerate demonstrates dilated loops of small bowel in the left mid abdomen with associated mild mesenteric stranding read as possible enteritis v SBO; clinically suspect this is related to anastamotic stricture with SBO. Her course otherwise complicated with AKI stage III, and possible upper GI bleeding with c/o recent melena, FOBT+. Due to her recurrent hypotension recommend for PCCM consultation, who I have contacted for the ED. If she does not require ICU consult and able to stabilize her BP I would recommend for a direct transfer to [facility] for further management of post-op complication and related conditions.   Hypotension, likely hypovolemic  Associated presyncopal episode  -Received 500 cc with EMS, additional 2.5 liters in the ED.  Initially fluid responsive but recurrent hypotension -Repeat lactate to evaluate perfusion, repeat blood counts -PCCM consult, contacted Dr. 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 Cara  - If stabilized from hypotension standpoint, would recommend transfer to [facility] for postop complication and bariatric surgery evaluation.  - If remains here will need surgical consultation - Keep n.p.o. for now - Maintenance IV fluid LR at 100 cc an hour - Holding home antiplatelets in case requires surgical intervention + in setting of bleed   Possible upper GI bleed - Messaged  GI Dr. Mcgreal for consultation in case she remains at [facility]  - N.p.o. for now - Pantoprazole  40 mg IV twice daily - Trend CBC, type and screen  Acute kidney injury stage III Baseline creatinine 0.7 - 0.9. Elevated to 2.74 on admission. Likely prerenal in setting of hypovolemia  -IV fluids per above -Check renal ultrasound, UA  -Hold her home olmesartan  Chronic medical problems: Holding PO meds with above problems  CVA '13: Hold home aspirin  and Plavix I/s/o SBO / bleed  HTN: Hold home Lisinopril in setting of hypotension HLD: tempoarily hold home Atorvastatin   Mood d/o: temporarily holding Bupropion , Citalopram  OSA: Not using CPAP  Nutrition: Temporary hold on bariatric vitamins   Disposition: Pending PCCM consultation. If stabilizes and does not require ICU admission then I recommend for direct transfer to [facility] for post operative complication.    TRH will sign off at present, please call us  again when needed.  ==================================================  CC:  presyncope   HPI: Megan Hale is a 48 y.o. female with past medical history of recent roux en Y gastric bypass at [facility] on 6/25, remote CVA '13, HTN, HLD, OSA, mood d/o, who presented due to presyncopal episode.  Reports significant difficulty taking p.o. since her surgery.  She is  taking about 30 ounces of any liquid throughout the day and not able to get much other food, currently on pureed diet.  She has been having frequent belching and  regurgitation even when trying to take sips.  She had been constipated and not passing significant gas.  She has been using a suppository and had small soft black stool earlier today after suppository.  No bright red blood.  She has abdominal discomfort and nausea.  She had recent small bowel follow-through to assess her symptoms.  Today she got curbside groceries and even just walking to get groceries and coming back to her apartment she was presyncopal having visual change.  Then try to get up later to put the groceries away in the fridge and again had a presyncopal episode and nearly passed out.  Called EMS and was initially hypotensive in 70s over 40s.  Review of Systems: As mentioned in the history of present illness. All other systems reviewed and are negative. Past Medical History:  Diagnosis Date   Anxiety    CVA (cerebral vascular accident) (HCC)    Acute left Basal Ganglia - right hemiplagia - Dr Daralyn   Depression    Hyperlipidemia    Hypertension    Migraine    Morbid obesity (HCC)    OSA (obstructive sleep apnea)    Bi-Pap   Vitamin D deficiency    Past Surgical History:  Procedure Laterality Date   BUBBLE STUDY  01/12/2021   Procedure: BUBBLE STUDY;  Surgeon: Jeffrie Oneil BROCKS, MD;  Location: [facility] ENDOSCOPY;  Service: Cardiovascular;;   LAPAROSCOPIC GASTRIC SLEEVE RESECTION     TEE WITHOUT CARDIOVERSION N/A 01/12/2021   Procedure: TRANSESOPHAGEAL ECHOCARDIOGRAM (TEE);  Surgeon: Jeffrie Oneil BROCKS, MD;  Location: [facility] ENDOSCOPY;  Service: Cardiovascular;  Laterality: N/A;   Social History:  reports that she has never smoked. She has never used smokeless tobacco. She reports that she does not currently use alcohol. She reports that she does not use drugs.  No Known Allergies  Family History  Problem Relation Age of Onset   CVA Father 80   Hypertension Father    Stroke Mother    Heart disease Mother    Diabetes Mother    CVA Brother    Diabetes Brother        died of  complications from Diabetes   Diabetes Sister    Heart attack Brother    Diabetes Brother    Stroke Paternal Grandfather     Prior to Admission medications   Medication Sig Start Date End Date Taking? Authorizing Provider  aspirin  EC 81 MG tablet Take 81 mg by mouth daily.   Yes [provider]  atorvastatin  (LIPITOR) 40 MG tablet Take 40 mg by mouth every evening. 05/27/20  Yes [provider]  BIOTIN PO Take 1 tablet by mouth daily.   Yes [provider]  buPROPion  (WELLBUTRIN  XL) 300 MG 24 hr tablet Take 300 mg by mouth in the morning. 06/29/20  Yes [provider]  Cholecalciferol (VITAMIN D-3) 125 MCG (5000 UT) TABS Take 5,000 Units by mouth 3 (three) times a week.   Yes [provider]  citalopram (CELEXA) 20 MG tablet Take 20 mg by mouth daily. 04/11/23  Yes [provider]  clopidogrel (PLAVIX) 75 MG tablet Take 75 mg by mouth in the morning. 05/27/20  Yes [provider]  fluticasone (FLONASE) 50 MCG/ACT nasal spray Place 1 spray into both nostrils See admin instructions. Instill 1 spray into each  nostril (scheduled) every morning & may instill an additional dose in the evening if needed for allergies.   Yes [provider]  fluticasone-salmeterol (ADVAIR HFA) 115-21 MCG/ACT inhaler Inhale 2 puffs into the lungs 2 (two) times daily.   Yes [provider]  lisinopril (ZESTRIL) 10 MG tablet Take 10 mg by mouth in the morning and at bedtime. Morning & night   Yes [provider]  methocarbamol (ROBAXIN) 750 MG tablet Take 750 mg by mouth daily as needed for muscle spasms.   Yes [provider]  Multiple Vitamin (MULTIVITAMIN WITH MINERALS) TABS tablet Take 1 tablet by mouth in the morning.   Yes [provider]  Multiple Vitamins-Minerals (PROBIOTICS + BARIATRIC MULTI PO) Take 1 tablet by mouth daily.   Yes [provider]  omeprazole (PRILOSEC) 40 MG capsule Take 40 mg by  mouth daily. 11/29/23  Yes [provider]  traMADol (ULTRAM) 50 MG tablet Take 50 mg by mouth every 6 (six) hours as needed.   Yes [provider]  zonisamide (ZONEGRAN) 25 MG capsule Take 25 mg by mouth daily.   Yes [provider]  loratadine (CLARITIN) 10 MG tablet Take 10 mg by mouth in the morning. AllerClear Patient not taking: Reported on 12/23/2023    [provider]  montelukast (SINGULAIR) 10 MG tablet Take 10 mg by mouth in the morning. Patient not taking: Reported on 12/23/2023    [provider]    Physical Exam: Vitals:   12/23/23 0015 12/23/23 0030 12/23/23 0130 12/23/23 0230  BP: (!) 84/52 (!) 78/42 (!) 81/40 (!) 84/52  Pulse: 72 67 71 68  Resp: 17 19    Temp:      TempSrc:      SpO2: 100% 100% 100% 100%  Weight:      Height:        Gen: Awake, alert, NAD  CV: Regular, normal S1, S2, no murmurs  Resp: Normal WOB, CTAB  Abd: Obese, well healed port incisions, hypoactive, minimal tenderness. No rebound , guarding, rigidity.  MSK: Symmetric, no edema  Skin: No rashes or lesions to exposed skin  Neuro: Alert and interactive  Psych: euthymic, appropriate     Data Reviewed:    Lactate 1.4 -> 1.3 Bicarb 20, AG 19 BUN 57, creatinine 2.74 Hemoglobin 11 FOBT positive  CTAP CT ABDOMEN PELVIS WO CONTRAST Result Date: 12/23/2023 EXAM: CT ABDOMEN AND PELVIS WITHOUT CONTRAST 12/23/2023 01:29:28 AM TECHNIQUE: CT of the abdomen and pelvis was performed without the administration of intravenous contrast. Multiplanar reformatted images are provided for review. Automated exposure control, iterative reconstruction, and/or weight based adjustment of the mA/kV was utilized to reduce the radiation dose to as low as reasonably achievable. COMPARISON: None available. CLINICAL HISTORY: Post roux en y bypass, PO intolerance, recent SB FT with contrast filled loops in jejunum. Eval for post op complication, bowel obstruction. Patient states  she drank half of a 500 ml bottle of pre mixed oral contrast mixture but started vomiting. Unable to tolerate more oral contrast, MD aware. Best images obtained. FINDINGS: LOWER CHEST: No acute abnormality. LIVER: The liver is unremarkable. GALLBLADDER AND BILE DUCTS: Gallbladder is unremarkable. No biliary ductal dilatation. SPLEEN: No acute abnormality. PANCREAS: No acute abnormality. ADRENAL GLANDS: No acute abnormality. KIDNEYS, URETERS AND BLADDER: No stones in the kidneys or ureters. No hydronephrosis. No perinephric or periureteral stranding. Urinary bladder is unremarkable. GI AND BOWEL: Status post gastric bypass. Mildly dilated loops of small bowel in the left  mid abdomen with associated mild mesenteric stranding (image 28), poorly evaluated on unenhanced CT, but favoring small bowel enteritis over small bowel obstruction. Normal appendix (image 56). PERITONEUM AND RETROPERITONEUM: No ascites. No free air. VASCULATURE: Aorta is normal in caliber. LYMPH NODES: No lymphadenopathy. REPRODUCTIVE ORGANS: No acute abnormality. BONES AND SOFT TISSUES: Mild degenerative changes of the visualized thoracolumbar spine. No acute osseous abnormality. No focal soft tissue abnormality. IMPRESSION: 1. Status post gastric bypass. 2. Mildly dilated loops of small bowel in the left mid abdomen, favoring small bowel enteritis over small bowel obstruction. Electronically signed by: Pinkie Pebbles MD 12/23/2023 01:38 AM EDT RP Workstation: [id number]   DG Chest Port 1 View Result Date: 12/22/2023 EXAM: 1 VIEW XRAY OF THE CHEST 12/22/2023 06:50:00 PM COMPARISON: 09/03/2017 CLINICAL HISTORY: Questionable sepsis - evaluate for abnormality. Per triage: Pt via [facility] from home after near-syncopal episode. Pt has RNY Gastric bypass surgery June 25th and is still unable to tolerate PO intake with constant nausea and vomiting bile for weeks. Also notes black stool; no iron supplements. EMS reports no palpable BP or radial pulse at  time of arrival on scene. Pt arrives to ED with GCS 15, tearful/emotional, and fatigued. States she had been able to graduate to soft foods very briefly before her extreme nausea set in. No pain. FINDINGS: LUNGS AND PLEURA: No focal pulmonary opacity. No pulmonary edema. No pleural effusion. No pneumothorax. HEART AND MEDIASTINUM: No acute abnormality of the cardiac and mediastinal silhouettes. BONES AND SOFT TISSUES: No acute osseous abnormality. IMPRESSION: 1. No acute findings. Electronically signed by: Pinkie Pebbles MD 12/22/2023 07:18 PM EDT RP Workstation: [id number]     Family Communication: None  Primary team communication: I discussed plan for CT prior to disposition with Dr. Randol, I have updated Dr. Nettie as well.  Thank you very much for involving us  in the care of your patient.  Author: Dorn Dawson, MD 12/23/2023 2:42 AM  For on call review [email or web address].

## 2023-12-23 NOTE — ED Notes (Signed)
 Ultrasound at bedside

## 2023-12-23 NOTE — H&P (Signed)
 NAME:  Megan Hale, MRN:  [id number], DOB:  1975/08/01, LOS: 1 ADMISSION DATE:  12/22/2023, CONSULTATION DATE: 12/23/2023 REFERRING MD: Keturah Carrier, MD, CHIEF COMPLAINT: hypotension    History of Present Illness:  A 48 yr old female patient with stroke (2013 with residual right hand and leg weakness), recent TIA (on baby ASA daily), PFO, morbid obesity (s/p roux en Y gastric bypass at [facility] on 11/28/2023), migraine, depression, HTN, ACEI induced cough, and OSA (not using CPAP). Ever since then she has been having trouble with nausea and vomiting (1-2 times daily). She had constipation for 3 weeks that converted to diarrhea after rectal suppositories use in preparation for small bowel follow through one week ago, since then she has loose BM (melena) almost 3 times daily. Patient states more recently the symptoms have been worse over the last couple weeks. She has been following with her surgeon as well as GI. Patient states she was shopping today was going home and suddenly felt very weak like she was going to pass out. EMS was called. They noted that her blood pressure was low (70s). Patient states she is not having any abdominal pain, f/c/r, CP, SOB, dysuria, back pain, LL edema, or URTI symptoms. She has not noticed any bright red blood in her stool. No previous hx of GI bleeding. Denied smoking, alcohol drinking, or illicit drug use.   Pertinent  Medical History  Stroke (2013 with residual right hand and leg weakness), recent TIA (on baby ASA daily), PFO, morbid obesity (s/p roux en Y gastric bypass at [facility] on 11/28/2023), migraine, depression, HTN, ACEI induced cough, OSA (not using CPAP). Gastric sleeve 2017  Significant Hospital Events: Including procedures, antibiotic start and stop dates in addition to other pertinent events   7/19: Given 3 L LR, but continued to be hypotensive. PCCM was consulted  7/20: admit to ICU  Interim History / Subjective:    Objective    Blood pressure  (!) 88/50, pulse 66, temperature 97.7 F (36.5 C), temperature source Oral, resp. rate 16, height 5' 6.5 (1.689 m), weight 115.7 kg, last menstrual period 12/08/2023, SpO2 100%.        Intake/Output Summary (Last 24 hours) at 12/23/2023 0323 Last data filed at 12/23/2023 0242 Gross per 24 hour  Intake 2750 ml  Output 245 ml  Net 2505 ml   Filed Weights   12/22/23 1816  Weight: 115.7 kg    Examination: General: alert, oriented x4, and comfortable. On RA. SpO2 99%  HENT: PERL, normal pharynx and oral mucosa. No LNE or thyromegaly. No JVD Lungs: symmetrical air entry bilaterally. No crackles or wheezing Cardiovascular: NL S1/S2. No m/g/r Abdomen: no distension or tenderness. No surgical site infection signs Extremities: no edema. Symmetrical  Neuro: mild right hand grip weakness.    Good peripheral pulses: DP and PT  Resolved problem list   Assessment and Plan  Hypotension due to dehydration -Admit to ICU -Another 1 L of LR -Increase maintenance LR rate -Midodrine  -NPO -Cortisol level -I/O chart -Am labs -Levophed  for MAP >65 mmHg -?CVC and a-line -IV Ca x1  UGIB with melena -Consult GI -PPI -Serial labs  AKI -IVF -Avoid nephrotoxins  Recent TIA (on baby ASA daily)/PFO -Echo, consider closure with Amplatzer device -Hold ASA  Morbid obesity (s/p roux en Y gastric bypass at [facility] on 11/28/2023) -She is refusing transfer to [facility]  Depression -Resume home meds  HTN: ACEI induced cough -Hold BP meds  OSA (not using CPAP)  Best Practice (right  click and Reselect all SmartList Selections daily)   Diet/type: NPO w/ oral meds DVT prophylaxis SCD Pressure ulcer(s): N/A GI prophylaxis: PPI Lines: N/A Foley:  N/A Code Status:  full code Last date of multidisciplinary goals of care discussion []   Labs   CBC: Recent Labs  Lab 12/22/23 1845  WBC 7.1  NEUTROABS 5.1  HGB 11.0*  HCT 35.4*  MCV 87.2  PLT 224    Basic Metabolic Panel: Recent Labs   Lab 12/22/23 1845  NA 139  K 4.0  CL 100  CO2 20*  GLUCOSE 91  BUN 57*  CREATININE 2.74*  CALCIUM  8.6*   GFR: Estimated Creatinine Clearance: 32.4 mL/min (A) (by C-G formula based on SCr of 2.74 mg/dL (H)). Recent Labs  Lab 12/22/23 1845 12/22/23 1906 12/22/23 2037  WBC 7.1  --   --   LATICACIDVEN  --  1.4 1.3    Liver Function Tests: Recent Labs  Lab 12/22/23 1845  AST 16  ALT 12  ALKPHOS 56  BILITOT 0.9  PROT 7.0  ALBUMIN 3.2*   No results for input(s): LIPASE, AMYLASE in the last 168 hours. No results for input(s): AMMONIA in the last 168 hours.  ABG    Component Value Date/Time   TCO2 26 11/25/2022 2117     Coagulation Profile: Recent Labs  Lab 12/22/23 1845  INR 1.1    Cardiac Enzymes: No results for input(s): CKTOTAL, CKMB, CKMBINDEX, TROPONINI in the last 168 hours.  HbA1C: Hemoglobin A1C  Date/Time Value Ref Range Status  09/04/2013 04:42 PM 5.7  Final  02/25/2013 05:02 PM 5.7  Final    CBG: No results for input(s): GLUCAP in the last 168 hours.  Review of Systems:   Review of Systems  Constitutional:  Positive for malaise/fatigue. Negative for chills, diaphoresis and fever.  HENT:  Negative for congestion, sinus pain and sore throat.   Eyes:  Negative for blurred vision and double vision.  Respiratory:  Negative for cough, hemoptysis, sputum production, shortness of breath, wheezing and stridor.   Cardiovascular:  Negative for chest pain, palpitations, orthopnea, claudication, leg swelling and PND.  Gastrointestinal:  Positive for abdominal pain, diarrhea, melena, nausea and vomiting. Negative for blood in stool, constipation and heartburn.  Genitourinary:  Negative for dysuria, frequency and urgency.  Musculoskeletal:  Negative for back pain, falls and neck pain.  Skin:  Negative for itching and rash.  Neurological:  Positive for focal weakness. Negative for tremors, sensory change, speech change and seizures.      Past Medical History:  She,  has a past medical history of Anxiety, CVA (cerebral vascular accident) (HCC), Depression, Hyperlipidemia, Hypertension, Migraine, Morbid obesity (HCC), OSA (obstructive sleep apnea), and Vitamin D deficiency.   Surgical History:   Past Surgical History:  Procedure Laterality Date   BUBBLE STUDY  01/12/2021   Procedure: BUBBLE STUDY;  Surgeon: Jeffrie Oneil BROCKS, MD;  Location: [facility] ENDOSCOPY;  Service: Cardiovascular;;   LAPAROSCOPIC GASTRIC SLEEVE RESECTION     TEE WITHOUT CARDIOVERSION N/A 01/12/2021   Procedure: TRANSESOPHAGEAL ECHOCARDIOGRAM (TEE);  Surgeon: Jeffrie Oneil BROCKS, MD;  Location: [facility] ENDOSCOPY;  Service: Cardiovascular;  Laterality: N/A;     Social History:   reports that she has never smoked. She has never used smokeless tobacco. She reports that she does not currently use alcohol. She reports that she does not use drugs.   Family History:  Her family history includes CVA in her brother; CVA (age of onset: 59) in her father; Diabetes in  her brother, brother, mother, and sister; Heart attack in her brother; Heart disease in her mother; Hypertension in her father; Stroke in her mother and paternal grandfather.   Allergies No Known Allergies   Home Medications  Prior to Admission medications   Medication Sig Start Date End Date Taking? Authorizing Provider  aspirin  EC 81 MG tablet Take 81 mg by mouth daily.   Yes [provider]  atorvastatin  (LIPITOR) 40 MG tablet Take 40 mg by mouth every evening. 05/27/20  Yes [provider]  BIOTIN PO Take 1 tablet by mouth daily.   Yes [provider]  buPROPion  (WELLBUTRIN  XL) 300 MG 24 hr tablet Take 300 mg by mouth in the morning. 06/29/20  Yes [provider]  Cholecalciferol (VITAMIN D-3) 125 MCG (5000 UT) TABS Take 5,000 Units by mouth 3 (three) times a week.   Yes [provider]  citalopram (CELEXA) 20 MG tablet Take 20 mg by mouth daily. 04/11/23  Yes  [provider]  clopidogrel (PLAVIX) 75 MG tablet Take 75 mg by mouth in the morning. 05/27/20  Yes [provider]  fluticasone (FLONASE) 50 MCG/ACT nasal spray Place 1 spray into both nostrils See admin instructions. Instill 1 spray into each nostril (scheduled) every morning & may instill an additional dose in the evening if needed for allergies.   Yes [provider]  fluticasone-salmeterol (ADVAIR HFA) 115-21 MCG/ACT inhaler Inhale 2 puffs into the lungs 2 (two) times daily.   Yes [provider]  lisinopril (ZESTRIL) 10 MG tablet Take 10 mg by mouth in the morning and at bedtime. Morning & night   Yes [provider]  methocarbamol (ROBAXIN) 750 MG tablet Take 750 mg by mouth daily as needed for muscle spasms.   Yes [provider]  Multiple Vitamin (MULTIVITAMIN WITH MINERALS) TABS tablet Take 1 tablet by mouth in the morning.   Yes [provider]  Multiple Vitamins-Minerals (PROBIOTICS + BARIATRIC MULTI PO) Take 1 tablet by mouth daily.   Yes [provider]  omeprazole (PRILOSEC) 40 MG capsule Take 40 mg by mouth daily. 11/29/23  Yes [provider]  traMADol (ULTRAM) 50 MG tablet Take 50 mg by mouth every 6 (six) hours as needed.   Yes [provider]  zonisamide (ZONEGRAN) 25 MG capsule Take 25 mg by mouth daily.   Yes [provider]  loratadine (CLARITIN) 10 MG tablet Take 10 mg by mouth in the morning. AllerClear Patient not taking: Reported on 12/23/2023    [provider]  montelukast (SINGULAIR) 10 MG tablet Take 10 mg by mouth in the morning. Patient not taking: Reported on 12/23/2023    [provider]     Critical care time: 34 min     Mancel Ply, MD [facility] Pager: see AMION

## 2023-12-23 NOTE — ED Notes (Signed)
 Pt unable to keep down the oral contrast; vomited 12cc

## 2023-12-23 NOTE — ED Notes (Addendum)
 RN notified MD of pt BP trend while MD at bedside.

## 2023-12-24 ENCOUNTER — Encounter: Payer: Self-pay | Admitting: Physician Assistant

## 2023-12-24 LAB — BLOOD CULTURE ID PANEL (REFLEXED) - BCID2

## 2023-12-25 LAB — CULTURE, BLOOD (ROUTINE X 2)
Culture  Setup Time: NO GROWTH
Special Requests: ADEQUATE

## 2023-12-25 LAB — CALCIUM, IONIZED: Calcium, Ionized, Serum: 4.9 mg/dL (ref 4.5–5.6)

## 2023-12-28 LAB — CULTURE, BLOOD (ROUTINE X 2)
Culture: NO GROWTH
Special Requests: ADEQUATE

## 2023-12-30 ENCOUNTER — Encounter (HOSPITAL_BASED_OUTPATIENT_CLINIC_OR_DEPARTMENT_OTHER): Payer: Self-pay | Admitting: Emergency Medicine

## 2023-12-30 ENCOUNTER — Other Ambulatory Visit: Payer: Self-pay

## 2023-12-30 DIAGNOSIS — R600 Localized edema: Secondary | ICD-10-CM | POA: Insufficient documentation

## 2023-12-30 DIAGNOSIS — Z794 Long term (current) use of insulin: Secondary | ICD-10-CM | POA: Insufficient documentation

## 2023-12-30 DIAGNOSIS — M7989 Other specified soft tissue disorders: Secondary | ICD-10-CM | POA: Insufficient documentation

## 2023-12-30 LAB — COMPREHENSIVE METABOLIC PANEL WITH GFR
ALT: 18 U/L (ref 0–44)
AST: 28 U/L (ref 15–41)
Albumin: 3.7 g/dL (ref 3.5–5.0)
Alkaline Phosphatase: 58 U/L (ref 38–126)
Anion gap: 15 (ref 5–15)
BUN: 11 mg/dL (ref 6–20)
CO2: 23 mmol/L (ref 22–32)
Calcium: 8.7 mg/dL — ABNORMAL LOW (ref 8.9–10.3)
Chloride: 105 mmol/L (ref 98–111)
Creatinine, Ser: 1.24 mg/dL — ABNORMAL HIGH (ref 0.44–1.00)
GFR, Estimated: 53 mL/min — ABNORMAL LOW (ref >60)
Glucose, Bld: 75 mg/dL (ref 70–99)
Potassium: 3.3 mmol/L — ABNORMAL LOW (ref 3.5–5.1)
Sodium: 143 mmol/L (ref 135–145)
Total Bilirubin: 0.4 mg/dL (ref 0.0–1.2)
Total Protein: 6.5 g/dL (ref 6.5–8.1)

## 2023-12-30 LAB — CBC
HCT: 33.2 % — ABNORMAL LOW (ref 36.0–46.0)
Hemoglobin: 10.2 g/dL — ABNORMAL LOW (ref 12.0–15.0)
MCH: 26.4 pg (ref 26.0–34.0)
MCHC: 30.7 g/dL (ref 30.0–36.0)
MCV: 85.8 fL (ref 80.0–100.0)
Platelets: 239 K/uL (ref 150–400)
RBC: 3.87 MIL/uL (ref 3.87–5.11)
RDW: 19.2 % — ABNORMAL HIGH (ref 11.5–15.5)
WBC: 4.9 K/uL (ref 4.0–10.5)
nRBC: 0 % (ref 0.0–0.2)

## 2023-12-30 LAB — LIPASE, BLOOD: Lipase: 74 U/L — ABNORMAL HIGH (ref 11–51)

## 2023-12-30 NOTE — ED Triage Notes (Signed)
 Pt POV recent hospital d/c Tuesday.  Reports new swelling to BLLE swelling, R worse than L x 4 days. Denies ShOB.   Pt tearful in triage.

## 2023-12-31 ENCOUNTER — Ambulatory Visit (HOSPITAL_BASED_OUTPATIENT_CLINIC_OR_DEPARTMENT_OTHER)
Admission: RE | Admit: 2023-12-31 | Discharge: 2023-12-31 | Disposition: A | Discharge status: Home / Self Care | Source: Ambulatory Visit | Attending: Emergency Medicine | Admitting: Emergency Medicine

## 2023-12-31 ENCOUNTER — Other Ambulatory Visit (HOSPITAL_BASED_OUTPATIENT_CLINIC_OR_DEPARTMENT_OTHER): Payer: Self-pay | Admitting: Emergency Medicine

## 2023-12-31 ENCOUNTER — Emergency Department (HOSPITAL_BASED_OUTPATIENT_CLINIC_OR_DEPARTMENT_OTHER)
Admission: EM | Admit: 2023-12-31 | Discharge: 2023-12-31 | Disposition: A | Discharge status: Home / Self Care | Attending: Emergency Medicine | Admitting: Emergency Medicine

## 2023-12-31 DIAGNOSIS — M7989 Other specified soft tissue disorders: Secondary | ICD-10-CM

## 2023-12-31 MED ORDER — ENOXAPARIN SODIUM 120 MG/0.8ML IJ SOSY
1.0000 mg/kg | PREFILLED_SYRINGE | Freq: Once | INTRAMUSCULAR | Status: AC
  Administered 2023-12-31: 120 mg via SUBCUTANEOUS
  Filled 2023-12-31: qty 0.8

## 2023-12-31 NOTE — ED Provider Notes (Signed)
 I discussed patient's negative DVT ultrasound results with her.  She is requesting a work note, which was given.  Instructed to follow-up with PCP.   Freddi Hamilton, MD 12/31/23 1145

## 2023-12-31 NOTE — ED Provider Notes (Signed)
 [place] [facility]  Provider Note  CSN: [id number] Arrival date & time: 12/30/23 2247  History Chief Complaint  Patient presents with   Leg Swelling    Megan Hale is a 48 y.o. 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 She was aggressively hydrated and ultimately discharged 6 days ago. She has noticed increased BLE swelling, R>L since then and has become concerned about DVT after searching the internet. She is on ASA/Plavix for prior stroke. Not on any other anticoagulation. She was getting subcutaneous DVT prophylaxis in the hospital. She has had improved PO intake. No CP or SOB.    Home Medications Prior to Admission medications   Medication Sig Start Date End Date Taking? Authorizing Provider  acetaminophen  (TYLENOL ) 500 MG tablet Take 2 tablets (1,000 mg total) by mouth every 6 (six) hours as needed for mild pain (pain score 1-3). 12/23/23   Hunsucker, Donnice SAUNDERS, MD  bisacodyl  (DULCOLAX) 10 MG suppository Place 1 suppository (10 mg total) rectally daily as needed for moderate constipation. 12/23/23   Hunsucker, Donnice SAUNDERS, MD  Chlorhexidine  Gluconate Cloth 2 % PADS Apply 6 each topically daily. 12/24/23   Hunsucker, Donnice SAUNDERS, MD  heparin  5000 UNIT/ML injection Inject 1 mL (5,000 Units total) into the skin every 8 (eight) hours. 12/23/23   Hunsucker, Donnice SAUNDERS, MD  insulin  aspart (NOVOLOG ) 100 UNIT/ML injection Inject 0-6 Units into the skin 3 (three) times daily with meals. 12/23/23   Hunsucker, Donnice SAUNDERS, MD  lactated ringers  infusion Inject 100 mLs into the vein continuous. 12/23/23   Hunsucker, Donnice SAUNDERS, MD  melatonin 3 MG TABS tablet Take 2 tablets (6 mg total) by mouth at bedtime as needed. 12/23/23   Hunsucker, Donnice SAUNDERS, MD  midodrine  (PROAMATINE ) 10 MG tablet Take 1 tablet (10 mg total) by mouth every 8 (eight) hours. 12/23/23   Hunsucker, Donnice SAUNDERS, MD  ondansetron  (ZOFRAN ) 4 MG/2ML SOLN injection Inject 2 mLs (4 mg total) into the vein every 6 (six) hours as needed for nausea or vomiting. 12/23/23   Hunsucker, Donnice SAUNDERS, MD  pantoprazole  (PROTONIX ) 40 MG injection Inject 40 mg into the vein every 12 (twelve) hours. 12/23/23   Hunsucker, Donnice SAUNDERS, MD  polyethylene glycol (MIRALAX  / GLYCOLAX ) 17 g packet Take 17 g by mouth daily as needed for mild constipation. 12/23/23   Hunsucker, Donnice SAUNDERS, MD  prochlorperazine  (COMPAZINE ) 10 MG/2ML injection Inject 2 mLs (10 mg total) into the vein every 6 (six) hours as needed. 12/23/23   Hunsucker, Donnice SAUNDERS, MD  simethicone  (MYLICON) 80 MG chewable tablet Chew 1 tablet (80 mg total) by mouth 4 (four) times daily as needed for flatulence. 12/23/23   Hunsucker, Donnice SAUNDERS, MD  sodium chloride  0.9 % infusion Inject 250 mLs into the vein continuous. 12/23/23   Hunsucker, Donnice SAUNDERS, MD     Allergies    Patient has no known allergies.   Review of Systems   Review of Systems Please see HPI for pertinent positives and negatives  Physical Exam BP 120/86 (BP Location: Left Arm)   Pulse 84   Temp 98 F (36.7 C)   Resp 20   Ht 5' 6.5 (1.689 m)   Wt 120.7 kg   LMP 12/08/2023 (Approximate)   SpO2 98%   BMI 42.29 kg/m   Physical Exam Vitals and nursing note reviewed.  Constitutional:  Appearance: Normal appearance.  HENT:     Head: Normocephalic and atraumatic.     Nose: Nose normal.     Mouth/Throat:     Mouth: Mucous membranes are moist.  Eyes:     Extraocular Movements: Extraocular movements intact.     Conjunctiva/sclera: Conjunctivae normal.  Cardiovascular:     Rate and Rhythm: Normal rate.  Pulmonary:     Effort: Pulmonary effort is normal.     Breath sounds: Normal breath sounds.  Abdominal:     General: Abdomen is flat.     Palpations: Abdomen is soft.     Tenderness: There is no abdominal tenderness.  Musculoskeletal:        General: No swelling. Normal range of motion.      Cervical back: Neck supple.     Right lower leg: Edema present.     Left lower leg: Edema present.  Skin:    General: Skin is warm and dry.  Neurological:     General: No focal deficit present.     Mental Status: She is alert.  Psychiatric:        Mood and Affect: Mood normal.     ED Results / Procedures / Treatments   EKG None  Procedures Procedures  Medications Ordered in the ED Medications  enoxaparin  (LOVENOX ) injection 120 mg (has no administration in time range)    Initial Impression and Plan  Patient here with BLE edema after recent admission for AKI. Suspect this is a consequence of the fluids she received but will also need DVT rule out. Labs done here show Cr at baseline, otherwise unremarkable. Will give a dose of Lovenox  and have her return in the AM for US .   ED Course       MDM Rules/Calculators/A&P Medical Decision Making Problems Addressed: Leg swelling: acute illness or injury  Amount and/or Complexity of Data Reviewed Labs: ordered. Decision-making details documented in ED Course. Radiology: ordered.  Risk Prescription drug management.     Final Clinical Impression(s) / ED Diagnoses Final diagnoses:  Leg swelling    Rx / DC Orders ED Discharge Orders          Ordered    Lower Ext Bilat Venous US        Comments: IMPORTANT PATIENT INSTRUCTIONS:  You have been scheduled for an Outpatient Ultrasound.    Your appointment has been scheduled for:  _______ am/pm on _______________ (date).  If your appointment is scheduled for a Saturday, Sunday or holiday, please go to the [facility] Emergency Department Registration Desk at least 15 minutes prior to your appointment time and tell them you are there for an ultrasound.    If your appointment is scheduled for a weekday (Monday - Friday), please go directly to the [facility] Radiology Department reception area at least 15 minutes prior to your appointment time and tell them  you are there for an ultrasound.  Please call [phone] with questions.   12/31/23 0055             Roselyn Carlin NOVAK, MD 12/31/23 [phone]
